# Patient Record
Sex: Female | Born: 1975 | Race: Black or African American | Hispanic: No | Marital: Single | Smoking: Former smoker
Health system: Southern US, Community
[De-identification: ages and names within clinical notes are randomized; demographics above are authoritative.]

## PROBLEM LIST (undated history)

## (undated) ENCOUNTER — Inpatient Hospital Stay (HOSPITAL_COMMUNITY): Payer: Self-pay

## (undated) ENCOUNTER — Emergency Department (HOSPITAL_COMMUNITY): Payer: Managed Care, Other (non HMO) | Source: Home / Self Care

## (undated) DIAGNOSIS — T8859XA Other complications of anesthesia, initial encounter: Secondary | ICD-10-CM

## (undated) DIAGNOSIS — I639 Cerebral infarction, unspecified: Secondary | ICD-10-CM

## (undated) DIAGNOSIS — K589 Irritable bowel syndrome without diarrhea: Secondary | ICD-10-CM

## (undated) DIAGNOSIS — T4145XA Adverse effect of unspecified anesthetic, initial encounter: Secondary | ICD-10-CM

## (undated) DIAGNOSIS — G43909 Migraine, unspecified, not intractable, without status migrainosus: Secondary | ICD-10-CM

## (undated) DIAGNOSIS — B999 Unspecified infectious disease: Secondary | ICD-10-CM

## (undated) DIAGNOSIS — I10 Essential (primary) hypertension: Secondary | ICD-10-CM

## (undated) DIAGNOSIS — K219 Gastro-esophageal reflux disease without esophagitis: Secondary | ICD-10-CM

## (undated) DIAGNOSIS — R002 Palpitations: Secondary | ICD-10-CM

## (undated) DIAGNOSIS — F329 Major depressive disorder, single episode, unspecified: Secondary | ICD-10-CM

## (undated) DIAGNOSIS — IMO0002 Reserved for concepts with insufficient information to code with codable children: Secondary | ICD-10-CM

## (undated) DIAGNOSIS — S0990XA Unspecified injury of head, initial encounter: Secondary | ICD-10-CM

## (undated) DIAGNOSIS — R600 Localized edema: Principal | ICD-10-CM

## (undated) DIAGNOSIS — D219 Benign neoplasm of connective and other soft tissue, unspecified: Secondary | ICD-10-CM

## (undated) DIAGNOSIS — R87619 Unspecified abnormal cytological findings in specimens from cervix uteri: Secondary | ICD-10-CM

## (undated) DIAGNOSIS — D649 Anemia, unspecified: Secondary | ICD-10-CM

## (undated) DIAGNOSIS — D519 Vitamin B12 deficiency anemia, unspecified: Secondary | ICD-10-CM

## (undated) HISTORY — DX: Unspecified injury of head, initial encounter: S09.90XA

## (undated) HISTORY — DX: Major depressive disorder, single episode, unspecified: F32.9

## (undated) HISTORY — DX: Cerebral infarction, unspecified: I63.9

## (undated) HISTORY — DX: Reserved for concepts with insufficient information to code with codable children: IMO0002

## (undated) HISTORY — DX: Essential (primary) hypertension: I10

## (undated) HISTORY — DX: Unspecified abnormal cytological findings in specimens from cervix uteri: R87.619

## (undated) HISTORY — DX: Localized edema: R60.0

## (undated) HISTORY — DX: Adverse effect of unspecified anesthetic, initial encounter: T41.45XA

## (undated) HISTORY — DX: Benign neoplasm of connective and other soft tissue, unspecified: D21.9

## (undated) HISTORY — DX: Other complications of anesthesia, initial encounter: T88.59XA

## (undated) HISTORY — DX: Gastro-esophageal reflux disease without esophagitis: K21.9

## (undated) HISTORY — DX: Migraine, unspecified, not intractable, without status migrainosus: G43.909

## (undated) HISTORY — DX: Irritable bowel syndrome, unspecified: K58.9

## (undated) HISTORY — PX: LAPAROSCOPY: SHX197

## (undated) HISTORY — DX: Palpitations: R00.2

## (undated) HISTORY — DX: Anemia, unspecified: D64.9

## (undated) HISTORY — DX: Unspecified infectious disease: B99.9

---

## 1997-02-10 HISTORY — PX: INDUCED ABORTION: SHX677

## 1998-02-10 DIAGNOSIS — F32A Depression, unspecified: Secondary | ICD-10-CM

## 1998-02-10 HISTORY — DX: Depression, unspecified: F32.A

## 2000-02-11 HISTORY — PX: TONSILLECTOMY: SUR1361

## 2003-02-11 HISTORY — PX: WISDOM TOOTH EXTRACTION: SHX21

## 2004-08-05 ENCOUNTER — Inpatient Hospital Stay (HOSPITAL_COMMUNITY): Admission: AD | Admit: 2004-08-05 | Discharge: 2004-08-05 | Payer: Self-pay | Admitting: Obstetrics and Gynecology

## 2004-10-17 ENCOUNTER — Other Ambulatory Visit: Admission: RE | Admit: 2004-10-17 | Discharge: 2004-10-17 | Payer: Self-pay | Admitting: Obstetrics and Gynecology

## 2005-03-13 ENCOUNTER — Inpatient Hospital Stay (HOSPITAL_COMMUNITY): Admission: AD | Admit: 2005-03-13 | Discharge: 2005-03-13 | Payer: Self-pay | Admitting: Obstetrics and Gynecology

## 2005-03-25 ENCOUNTER — Inpatient Hospital Stay (HOSPITAL_COMMUNITY): Admission: AD | Admit: 2005-03-25 | Discharge: 2005-03-27 | Payer: Self-pay | Admitting: Obstetrics and Gynecology

## 2005-03-29 ENCOUNTER — Encounter: Admission: RE | Admit: 2005-03-29 | Discharge: 2005-04-25 | Payer: Self-pay | Admitting: Obstetrics and Gynecology

## 2005-04-26 ENCOUNTER — Encounter: Admission: RE | Admit: 2005-04-26 | Discharge: 2005-05-26 | Payer: Self-pay | Admitting: Obstetrics and Gynecology

## 2005-05-27 ENCOUNTER — Encounter: Admission: RE | Admit: 2005-05-27 | Discharge: 2005-06-17 | Payer: Self-pay | Admitting: Obstetrics and Gynecology

## 2005-11-03 ENCOUNTER — Other Ambulatory Visit: Admission: RE | Admit: 2005-11-03 | Discharge: 2005-11-03 | Payer: Self-pay | Admitting: Obstetrics and Gynecology

## 2006-01-26 ENCOUNTER — Ambulatory Visit (HOSPITAL_COMMUNITY): Payer: Self-pay | Admitting: Psychiatry

## 2006-02-20 ENCOUNTER — Ambulatory Visit (HOSPITAL_COMMUNITY): Payer: Self-pay | Admitting: Psychiatry

## 2006-11-12 ENCOUNTER — Emergency Department (HOSPITAL_COMMUNITY): Admission: EM | Admit: 2006-11-12 | Discharge: 2006-11-13 | Payer: Self-pay | Admitting: Emergency Medicine

## 2008-09-11 ENCOUNTER — Encounter: Admission: RE | Admit: 2008-09-11 | Discharge: 2008-09-11 | Payer: Self-pay | Admitting: Obstetrics and Gynecology

## 2009-04-10 ENCOUNTER — Inpatient Hospital Stay (HOSPITAL_COMMUNITY): Admission: AD | Admit: 2009-04-10 | Discharge: 2009-04-12 | Payer: Self-pay | Admitting: Obstetrics and Gynecology

## 2009-12-18 ENCOUNTER — Emergency Department (HOSPITAL_COMMUNITY): Admission: EM | Admit: 2009-12-18 | Discharge: 2009-12-18 | Payer: Self-pay | Admitting: Emergency Medicine

## 2010-02-10 DIAGNOSIS — I639 Cerebral infarction, unspecified: Secondary | ICD-10-CM

## 2010-02-10 DIAGNOSIS — I10 Essential (primary) hypertension: Secondary | ICD-10-CM

## 2010-02-10 HISTORY — DX: Essential (primary) hypertension: I10

## 2010-02-10 HISTORY — DX: Cerebral infarction, unspecified: I63.9

## 2010-05-05 LAB — CBC
HCT: 26.6 % — ABNORMAL LOW (ref 36.0–46.0)
Hemoglobin: 10.1 g/dL — ABNORMAL LOW (ref 12.0–15.0)
Hemoglobin: 8.7 g/dL — ABNORMAL LOW (ref 12.0–15.0)
RBC: 3.02 MIL/uL — ABNORMAL LOW (ref 3.87–5.11)
RBC: 3.54 MIL/uL — ABNORMAL LOW (ref 3.87–5.11)
WBC: 5.3 10*3/uL (ref 4.0–10.5)
WBC: 9.2 10*3/uL (ref 4.0–10.5)

## 2010-05-05 LAB — RPR: RPR Ser Ql: NONREACTIVE

## 2010-05-05 LAB — CCBB MATERNAL DONOR DRAW

## 2010-06-28 NOTE — H&P (Signed)
[name], [name] NO.:  [id number]   MEDICAL RECORD NO.:  [id number]          PATIENT TYPE:  MAT   LOCATION:  [facility]                          FACILITY:  [facility]   PHYSICIAN:  Osborn Coho, M.D.   DATE OF BIRTH:  13-Jan-1976   DATE OF ADMISSION:  03/25/2005  DATE OF DISCHARGE:                                HISTORY & PHYSICAL   [name] is a 35 year old single black female gravida 3, para 1-0-1-1 at  40-3/7 weeks who presents with regular uterine contractions since 3:30 a.m.  She denies leaking or bleeding.  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  Her prenatal laboratories  were collected on September 18, 2004 hemoglobin 11.4, hematocrit 33.9, platelets  186,000.  Blood type O+.  Antibody negative.  Sickle cell trait negative.  RPR nonreactive.  Rubella immune.  HSV 2 positive.  HSV 1 negative.  Gonorrhea and Chlamydia negative.  Pap from October 17, 2004 within normal  limits.  One-hour Glucola from January 08, 2005 was 94.  RPR at that time  was nonreactive.  Hemoglobin at that time was 9.8.  Culture of the vaginal  tract for group B Strep on February 18, 2005 was positive.   HISTORY OF PRESENT PREGNANCY:  Patient presented for care at [facility] on September 18, 2004 at 13-4/[redacted] weeks gestation.  Pregnancy  ultrasonography at 17-5/[redacted] weeks gestation shows growth consistent with  previous dating confirming [name] of March 22, 2005.  The patient has had a  history of abnormal Pap smears but her Pap smear with this pregnancy was  within normal limits.  Urine culture was sent at [redacted] weeks gestation due to  dysuria.  Culture was actually negative.  Patient was referred to [facility]  for counseling at [redacted] weeks gestation due to a history  of depression.  Patient at 36 weeks reported increased depression, but  denied suicidal or homicidal ideations.  Declined medications or counseling  at present.  She also declined HSV prophylaxis since she has never had an  HSV outbreak.  Rest of her prenatal care has been unremarkable.   OB HISTORY:  She is a gravida 3, para 1-0-1-1.  In 1997 she had a TAB.  In  July of 2004 she had a vaginal delivery of a female infant weighing 5 pounds  at [redacted] weeks gestation with a labor that was less than 5 hours in length.  She had an epidural for anesthesia.  Infant's name was [name] and he was  born in [place].   MEDICAL HISTORY:  She has no medication allergies.  She experienced menarche  at the age of 61 with 28-day cycles lasting five to seven days.  She has  used Ortho Tri-Cyclen in the past for contraception.  She had an abnormal  Pap smear two years ago.  She has had colposcopies and a LEEP procedure in  2003.  She had laparoscopy in 1998.  Her partner in the past was told he had  herpes.  Patient has never had an HSV outbreak.  She has had bacterial  vaginosis.  She reports having had the usual childhood illnesses.  She has a  history of anemia.  She has IBS.  She also has a history of depression.   SURGICAL HISTORY:  Remarkable for wisdom teeth extraction and laparoscopy.   FAMILY HISTORY:  Remarkable for multiple family members with heart disease  and hypertension.  Mother and maternal grandmother with varicose veins.  Maternal grandfather with diabetes.  Maternal grandmother with  fibroidectomy.  Mother with migraines.  Maternal aunt with unknown type of  cancer.   GENETIC HISTORY:  Remarkable for father of the baby's mother with unknown  type of heart problem.   SOCIAL HISTORY:  Patient is single.  Father of the baby is involved and  support.  His name is [name].  They both have two years of college education.  She is involved  full-time as a Theatre manager.  He is employed full-time  as a Psychiatric nurse.  They deny any alcohol, tobacco, or illicit drug use  with the pregnancy.   OBJECTIVE:  VITAL SIGNS:  Blood pressure 130/87.  Other vital signs are  stable.  She is afebrile.  HEENT:  Grossly within normal limits.  CHEST:  Clear to auscultation.  HEART:  Regular rate and rhythm.  ABDOMEN:  Gravid in contour with fundal height extending approximately 40 cm  above the pubic symphysis.  Fetal heart rate is reactive and reassuring with  occasional variables.  Contractions are every four minutes.  PELVIC:  4 cm, 90%, vertex -2 with intact bag of waters and no signs of HSV  lesions.  EXTREMITIES:  Normal.   ASSESSMENT:  1.  Intrauterine pregnancy at term.  2.  Early labor.  3.  Group B Strep positive.   PLAN:  1.  Admit to birthing suites for consult.  Dr. Su Hilt has been notified.  2.  Routine C.N.M. orders.  3.  Start penicillin G for group B Strep prophylaxis.  4.  Anticipate spontaneous vaginal birth.      Cam Hai, C.N.M.      Osborn Coho, M.D.  Electronically Signed    KS/MEDQ  D:  03/25/2005  T:  03/25/2005  Job:  [id number]

## 2011-02-11 NOTE — L&D Delivery Note (Signed)
Delivery Note  Pt was complete w BBOW at 1843, AROM lg amt light MSAF, vtx descended and pt pushed w 2-3 ctx FHR stable   At 6:48 PM a viable female was delivered via Vaginal, Spontaneous Delivery (Presentation: ; ROA ). Shoulders delivered easily, Infant w lusty cry  APGAR: 9, 9; weight 7 lb 7 oz (3374 g).   Placenta status: spontaneous, intact,   Sent to path for hx HTN, Cord: 3 vessels with the following complications: None.  Cord pH: not done  Anesthesia: Epidural  Episiotomy: None Lacerations: 1st degree Suture Repair: 3-0 vicryl rapide Est. Blood Loss (mL): 350   Dr Pennie Rushing called to BS to evaluate laceration, cervix visualized w speculum and intact   Mom to postpartum.  Baby to nursery-stable. Pt plans to BF Desires BTL, Dr Pennie Rushing discussed w pt, consent on chart Will leave epidural in place and NPO after midnight   Navy Belay M 12/20/2011, 1:55 AM

## 2011-02-26 ENCOUNTER — Other Ambulatory Visit: Payer: Self-pay | Admitting: Obstetrics and Gynecology

## 2011-02-26 DIAGNOSIS — N644 Mastodynia: Secondary | ICD-10-CM

## 2011-03-05 ENCOUNTER — Ambulatory Visit
Admission: RE | Admit: 2011-03-05 | Discharge: 2011-03-05 | Disposition: A | Discharge status: Home / Self Care | Payer: Medicaid Other | Source: Ambulatory Visit | Attending: Obstetrics and Gynecology | Admitting: Obstetrics and Gynecology

## 2011-03-05 DIAGNOSIS — N644 Mastodynia: Secondary | ICD-10-CM

## 2011-05-15 ENCOUNTER — Ambulatory Visit: Payer: Self-pay | Admitting: Obstetrics and Gynecology

## 2011-05-16 ENCOUNTER — Other Ambulatory Visit: Payer: Self-pay | Admitting: Obstetrics and Gynecology

## 2011-05-16 ENCOUNTER — Encounter (INDEPENDENT_AMBULATORY_CARE_PROVIDER_SITE_OTHER): Payer: Medicaid Other | Admitting: Obstetrics and Gynecology

## 2011-05-16 DIAGNOSIS — N898 Other specified noninflammatory disorders of vagina: Secondary | ICD-10-CM

## 2011-05-16 DIAGNOSIS — R102 Pelvic and perineal pain: Secondary | ICD-10-CM

## 2011-05-16 DIAGNOSIS — N949 Unspecified condition associated with female genital organs and menstrual cycle: Secondary | ICD-10-CM

## 2011-05-16 DIAGNOSIS — N912 Amenorrhea, unspecified: Secondary | ICD-10-CM

## 2011-05-16 DIAGNOSIS — Z331 Pregnant state, incidental: Secondary | ICD-10-CM

## 2011-05-20 ENCOUNTER — Telehealth: Payer: Self-pay | Admitting: Obstetrics and Gynecology

## 2011-05-21 ENCOUNTER — Telehealth: Payer: Self-pay | Admitting: Obstetrics and Gynecology

## 2011-05-21 ENCOUNTER — Other Ambulatory Visit: Payer: Self-pay | Admitting: Obstetrics and Gynecology

## 2011-05-21 MED ORDER — NIFEDIPINE ER OSMOTIC RELEASE 30 MG PO TB24: 30.0000 mg | ORAL_TABLET | Freq: Every day | ORAL | Status: DC

## 2011-05-21 NOTE — Telephone Encounter (Signed)
TC to pt.   Informed per Dr Doy Hutching will call in new RX.  To call with any concerns.   To keep appt. 05/26/11.  Pt verbalizes comprehension.  Novia Lansberry, Sabino Snipes

## 2011-05-21 NOTE — Telephone Encounter (Signed)
Returned pt's call.   States left message for after hours but never received call back.   States took Labetalol x2 and caused hands to shake.   Feels  Has though BP is inc.   To consult with EP.

## 2011-05-26 ENCOUNTER — Other Ambulatory Visit: Payer: Medicaid Other

## 2011-05-26 ENCOUNTER — Other Ambulatory Visit: Payer: Self-pay | Admitting: Obstetrics and Gynecology

## 2011-05-26 ENCOUNTER — Encounter: Payer: Self-pay | Admitting: Obstetrics and Gynecology

## 2011-05-26 ENCOUNTER — Ambulatory Visit (INDEPENDENT_AMBULATORY_CARE_PROVIDER_SITE_OTHER): Payer: Medicaid Other | Admitting: Obstetrics and Gynecology

## 2011-05-26 ENCOUNTER — Ambulatory Visit: Payer: Medicaid Other

## 2011-05-26 VITALS — BP 120/82 | Temp 98.8°F | Wt 120.0 lb

## 2011-05-26 DIAGNOSIS — R102 Pelvic and perineal pain: Secondary | ICD-10-CM

## 2011-05-26 DIAGNOSIS — D649 Anemia, unspecified: Secondary | ICD-10-CM

## 2011-05-26 DIAGNOSIS — R350 Frequency of micturition: Secondary | ICD-10-CM

## 2011-05-26 DIAGNOSIS — D509 Iron deficiency anemia, unspecified: Secondary | ICD-10-CM | POA: Insufficient documentation

## 2011-05-26 DIAGNOSIS — B977 Papillomavirus as the cause of diseases classified elsewhere: Secondary | ICD-10-CM

## 2011-05-26 DIAGNOSIS — I10 Essential (primary) hypertension: Secondary | ICD-10-CM

## 2011-05-26 DIAGNOSIS — B009 Herpesviral infection, unspecified: Secondary | ICD-10-CM

## 2011-05-26 DIAGNOSIS — A630 Anogenital (venereal) warts: Secondary | ICD-10-CM | POA: Insufficient documentation

## 2011-05-26 DIAGNOSIS — F329 Major depressive disorder, single episode, unspecified: Secondary | ICD-10-CM

## 2011-05-26 LAB — POCT URINALYSIS DIPSTICK
Bilirubin, UA: NEGATIVE
Ketones, UA: NEGATIVE
Nitrite, UA: NEGATIVE
pH, UA: 5

## 2011-05-26 NOTE — Patient Instructions (Signed)
Patient needs to be scheduled for a NOB visit ( 10w 2 d by ultrasound today). Patient also will need a note to return to work tomorrow.

## 2011-05-26 NOTE — Progress Notes (Signed)
Patient seen 05/16/11 for pelvic pain and 8w 5d pregnant by dates returns for follow up.Patient states that her pain is better.She believes that some of her pain is due to constipation. Patient also takes Procardia with no problems (had hands tremble with Labetalol).  O: Preliminary U/S results- 10 weeks 3 days with FHR 158 bpm and right ovarian cyst 3.9 x 3.3 x 3.4 cm with right ovary measuring 4.7 x 3.6 x 3.4cm;  left ovary- 2.5 x 1.7 x 1.8cm  U/A trace leuk/blood;  urine for OB culture  A; Pregnant 10w 3 d     Pelvic Pain-improving     Constipation     Urinary frequency due to pregnancy & increased fluids     HTN-improving  P: NOB visit      urine for culture

## 2011-05-27 LAB — US OB COMP LESS 14 WKS

## 2011-05-28 LAB — URINE CULTURE: Colony Count: NO GROWTH

## 2011-06-18 ENCOUNTER — Encounter: Payer: Self-pay | Admitting: Obstetrics and Gynecology

## 2011-06-30 ENCOUNTER — Ambulatory Visit (INDEPENDENT_AMBULATORY_CARE_PROVIDER_SITE_OTHER): Payer: Medicaid Other | Admitting: Obstetrics and Gynecology

## 2011-06-30 VITALS — BP 120/80

## 2011-06-30 DIAGNOSIS — Z331 Pregnant state, incidental: Secondary | ICD-10-CM

## 2011-06-30 DIAGNOSIS — D649 Anemia, unspecified: Secondary | ICD-10-CM

## 2011-06-30 LAB — POCT URINALYSIS DIPSTICK
Glucose, UA: NEGATIVE
Leukocytes, UA: NEGATIVE
Nitrite, UA: NEGATIVE
Protein, UA: NEGATIVE
Urobilinogen, UA: NEGATIVE

## 2011-06-30 LAB — TSH: TSH: 1.575 u[IU]/mL (ref 0.350–4.500)

## 2011-06-30 NOTE — Progress Notes (Signed)
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 CONSULTED WITH [facility] REGARDING HX OF PALPITATIONS AND SHORTNESS OF BREATH.  TSH ORDERED.

## 2011-07-01 LAB — PRENATAL PANEL VII
Antibody Screen: NEGATIVE
Basophils Absolute: 0 10*3/uL (ref 0.0–0.1)
Eosinophils Absolute: 0.1 10*3/uL (ref 0.0–0.7)
Eosinophils Relative: 2 % (ref 0–5)
Hepatitis B Surface Ag: NEGATIVE
Lymphocytes Relative: 32 % (ref 12–46)
Lymphs Abs: 1.8 10*3/uL (ref 0.7–4.0)
MCH: 25.7 pg — ABNORMAL LOW (ref 26.0–34.0)
MCV: 79.4 fL (ref 78.0–100.0)
Neutrophils Relative %: 59 % (ref 43–77)
Platelets: 181 10*3/uL (ref 150–400)
RBC: 3.74 MIL/uL — ABNORMAL LOW (ref 3.87–5.11)
RDW: 20.7 % — ABNORMAL HIGH (ref 11.5–15.5)
Rubella: 85.6 IU/mL — ABNORMAL HIGH
WBC: 5.8 10*3/uL (ref 4.0–10.5)

## 2011-07-01 LAB — CULTURE, OB URINE: Colony Count: NO GROWTH

## 2011-07-02 ENCOUNTER — Other Ambulatory Visit: Payer: Self-pay | Admitting: Obstetrics and Gynecology

## 2011-07-02 ENCOUNTER — Telehealth: Payer: Self-pay | Admitting: Obstetrics and Gynecology

## 2011-07-02 MED ORDER — FE BISGLYCINATE-FE POLYSAC 40-20 MG PO CAPS: 1.0000 | ORAL_CAPSULE | Freq: Two times a day (BID) | ORAL | Status: DC

## 2011-07-02 NOTE — Progress Notes (Signed)
Addended by: Malissa Hippo. on: 07/02/2011 07:34 AM   Modules accepted: Orders

## 2011-07-02 NOTE — Telephone Encounter (Signed)
TC to pt. LM to return call regarding Fe  RX.

## 2011-07-02 NOTE — Telephone Encounter (Signed)
TC from pt. Informed of  Hg results. Advised to take Fe RX that was ordered by SL.the patient. Pt verbalizes comprehension.

## 2011-07-08 ENCOUNTER — Ambulatory Visit (INDEPENDENT_AMBULATORY_CARE_PROVIDER_SITE_OTHER): Payer: Medicaid Other | Admitting: Obstetrics and Gynecology

## 2011-07-08 ENCOUNTER — Encounter: Payer: Self-pay | Admitting: Obstetrics and Gynecology

## 2011-07-08 VITALS — BP 110/78 | Ht 64.5 in | Wt 159.0 lb

## 2011-07-08 DIAGNOSIS — G43909 Migraine, unspecified, not intractable, without status migrainosus: Secondary | ICD-10-CM | POA: Insufficient documentation

## 2011-07-08 DIAGNOSIS — F329 Major depressive disorder, single episode, unspecified: Secondary | ICD-10-CM

## 2011-07-08 DIAGNOSIS — Z8619 Personal history of other infectious and parasitic diseases: Secondary | ICD-10-CM | POA: Insufficient documentation

## 2011-07-08 DIAGNOSIS — F1290 Cannabis use, unspecified, uncomplicated: Secondary | ICD-10-CM

## 2011-07-08 DIAGNOSIS — Z8614 Personal history of Methicillin resistant Staphylococcus aureus infection: Secondary | ICD-10-CM

## 2011-07-08 DIAGNOSIS — K589 Irritable bowel syndrome without diarrhea: Secondary | ICD-10-CM

## 2011-07-08 DIAGNOSIS — B977 Papillomavirus as the cause of diseases classified elsewhere: Secondary | ICD-10-CM

## 2011-07-08 DIAGNOSIS — Z331 Pregnant state, incidental: Secondary | ICD-10-CM

## 2011-07-08 DIAGNOSIS — A630 Anogenital (venereal) warts: Secondary | ICD-10-CM

## 2011-07-08 DIAGNOSIS — D649 Anemia, unspecified: Secondary | ICD-10-CM

## 2011-07-08 DIAGNOSIS — Z9851 Tubal ligation status: Secondary | ICD-10-CM

## 2011-07-08 DIAGNOSIS — B009 Herpesviral infection, unspecified: Secondary | ICD-10-CM

## 2011-07-08 DIAGNOSIS — Z8673 Personal history of transient ischemic attack (TIA), and cerebral infarction without residual deficits: Secondary | ICD-10-CM

## 2011-07-08 DIAGNOSIS — I10 Essential (primary) hypertension: Secondary | ICD-10-CM

## 2011-07-08 DIAGNOSIS — F17200 Nicotine dependence, unspecified, uncomplicated: Secondary | ICD-10-CM

## 2011-07-08 DIAGNOSIS — D259 Leiomyoma of uterus, unspecified: Secondary | ICD-10-CM

## 2011-07-08 DIAGNOSIS — F1210 Cannabis abuse, uncomplicated: Secondary | ICD-10-CM

## 2011-07-08 DIAGNOSIS — D219 Benign neoplasm of connective and other soft tissue, unspecified: Secondary | ICD-10-CM

## 2011-07-08 LAB — POCT WET PREP (WET MOUNT): pH: 4.5

## 2011-07-08 NOTE — Progress Notes (Signed)
Pap done 02/21/2011 WNL  Declines genetic screenings

## 2011-07-08 NOTE — Patient Instructions (Signed)
ABCs of Pregnancy A Antepartum care is very important. Be sure you see your doctor and get prenatal care as soon as you think you are pregnant. At this time, you will be tested for infection, genetic abnormalities and potential problems with you and the pregnancy. This is the time to discuss diet, exercise, work, medications, labor, pain medication during labor and the possibility of a cesarean delivery. Ask any questions that may concern you. It is important to see your doctor regularly throughout your pregnancy. Avoid exposure to toxic substances and chemicals - such as cleaning solvents, lead and mercury, some insecticides, and paint. Pregnant women should avoid exposure to paint fumes, and fumes that cause you to feel ill, dizzy or faint. When possible, it is a good idea to have a pre-pregnancy consultation with your caregiver to begin some important recommendations your caregiver suggests such as, taking folic acid, exercising, quitting smoking, avoiding alcoholic beverages, etc. B Breastfeeding is the healthiest choice for both you and your baby. It has many nutritional benefits for the baby and health benefits for the mother. It also creates a very tight and loving bond between the baby and mother. Talk to your doctor, your family and friends, and your employer about how you choose to feed your baby and how they can support you in your decision. Not all birth defects can be prevented, but a woman can take actions that may increase her chance of having a healthy baby. Many birth defects happen very early in pregnancy, sometimes before a woman even knows she is pregnant. Birth defects or abnormalities of any child in your or the father's family should be discussed with your caregiver. Get a good support bra as your breast size changes. Wear it especially when you exercise and when nursing.  C Celebrate the news of your pregnancy with the your spouse/father and family. Childbirth classes are helpful to  take for you and the spouse/father because it helps to understand what happens during the pregnancy, labor and delivery. Cesarean delivery should be discussed with your doctor so you are prepared for that possibility. The pros and cons of circumcision if it is a boy, should be discussed with your pediatrician. Cigarette smoking during pregnancy can result in low birth weight babies. It has been associated with infertility, miscarriages, tubal pregnancies, infant death (mortality) and poor health (morbidity) in childhood. Additionally, cigarette smoking may cause long-term learning disabilities. If you smoke, you should try to quit before getting pregnant and not smoke during the pregnancy. Secondary smoke may also harm a mother and her developing baby. It is a good idea to ask people to stop smoking around you during your pregnancy and after the baby is born. Extra calcium is necessary when you are pregnant and is found in your prenatal vitamin, in dairy products, green leafy vegetables and in calcium supplements. D A healthy diet according to your current weight and height, along with vitamins and mineral supplements should be discussed with your caregiver. Domestic abuse or violence should be made known to your doctor right away to get the situation corrected. Drink more water when you exercise to keep hydrated. Discomfort of your back and legs usually develops and progresses from the middle of the second trimester through to delivery of the baby. This is because of the enlarging baby and uterus, which may also affect your balance. Do not take illegal drugs. Illegal drugs can seriously harm the baby and you. Drink extra fluids (water is best) throughout pregnancy to help   your body keep up with the increases in your blood volume. Drink at least 6 to 8 glasses of water, fruit juice, or milk each day. A good way to know you are drinking enough fluid is when your urine looks almost like clear water or is very light  yellow.  E Eat healthy to get the nutrients you and your unborn baby need. Your meals should include the five basic food groups. Exercise (30 minutes of light to moderate exercise a day) is important and encouraged during pregnancy, if there are no medical problems or problems with the pregnancy. Exercise that causes discomfort or dizziness should be stopped and reported to your caregiver. Emotions during pregnancy can change from being ecstatic to depression and should be understood by you, your partner and your family. F Fetal screening with ultrasound, amniocentesis and monitoring during pregnancy and labor is common and sometimes necessary. Take 400 micrograms of folic acid daily both before, when possible, and during the first few months of pregnancy to reduce the risk of birth defects of the brain and spine. All women who could possibly become pregnant should take a vitamin with folic acid, every day. It is also important to eat a healthy diet with fortified foods (enriched grain products, including cereals, rice, breads, and pastas) and foods with natural sources of folate (orange juice, green leafy vegetables, beans, peanuts, broccoli, asparagus, peas, and lentils). The father should be involved with all aspects of the pregnancy including, the prenatal care, childbirth classes, labor, delivery, and postpartum time. Fathers may also have emotional concerns about being a father, financial needs, and raising a family. G Genetic testing should be done appropriately. It is important to know your family and the father's history. If there have been problems with pregnancies or birth defects in your family, report these to your doctor. Also, genetic counselors can talk with you about the information you might need in making decisions about having a family. You can call a major medical center in your area for help in finding a board-certified genetic counselor. Genetic testing and counseling should be done  before pregnancy when possible, especially if there is a history of problems in the mother's or father's family. Certain ethnic backgrounds are more at risk for genetic defects. H Get familiar with the hospital where you will be having your baby. Get to know how long it takes to get there, the labor and delivery area, and the hospital procedures. Be sure your medical insurance is accepted there. Get your home ready for the baby including, clothes, the baby's room (when possible), furniture and car seat. Hand washing is important throughout the day, especially after handling raw meat and poultry, changing the baby's diaper or using the bathroom. This can help prevent the spread of many bacteria and viruses that cause infection. Your hair may become dry and thinner, but will return to normal a few weeks after the baby is born. Heartburn is a common problem that can be treated by taking antacids recommended by your caregiver, eating smaller meals 5 or 6 times a day, not drinking liquids when eating, drinking between meals and raising the head of your bed 2 to 3 inches. I Insurance to cover you, the baby, doctor and hospital should be reviewed so that you will be prepared to pay any costs not covered by your insurance plan. If you do not have medical insurance, there are usually clinics and services available for you in your community. Take 30 milligrams of iron during   your pregnancy as prescribed by your doctor to reduce the risk of low red blood cells (anemia) later in pregnancy. All women of childbearing age should eat a diet rich in iron. J There should be a joint effort for the mother, father and any other children to adapt to the pregnancy financially, emotionally, and psychologically during the pregnancy. Join a support group for moms-to-be. Or, join a class on parenting or childbirth. Have the family participate when possible. K Know your limits. Let your caregiver know if you experience any of the  following:   Pain of any kind.   Strong cramps.   You develop a lot of weight in a short period of time (5 pounds in 3 to 5 days).   Vaginal bleeding, leaking of amniotic fluid.   Headache, vision problems.   Dizziness, fainting, shortness of breath.   Chest pain.   Fever of 102 F (38.9 C) or higher.   Gush of clear fluid from your vagina.   Painful urination.   Domestic violence.   Irregular heartbeat (palpitations).   Rapid beating of the heart (tachycardia).   Constant feeling sick to your stomach (nauseous) and vomiting.   Trouble walking, fluid retention (edema).   Muscle weakness.   If your baby has decreased activity.   Persistent diarrhea.   Abnormal vaginal discharge.   Uterine contractions at 20-minute intervals.   Back pain that travels down your leg.  L Learn and practice that what you eat and drink should be in moderation and healthy for you and your baby. Legal drugs such as alcohol and caffeine are important issues for pregnant women. There is no safe amount of alcohol a woman can drink while pregnant. Fetal alcohol syndrome, a disorder characterized by growth retardation, facial abnormalities, and central nervous system dysfunction, is caused by a woman's use of alcohol during pregnancy. Caffeine, found in tea, coffee, soft drinks and chocolate, should also be limited. Be sure to read labels when trying to cut down on caffeine during pregnancy. More than 200 foods, beverages, and over-the-counter medications contain caffeine and have a high salt content! There are coffees and teas that do not contain caffeine. M Medical conditions such as diabetes, epilepsy, and high blood pressure should be treated and kept under control before pregnancy when possible, but especially during pregnancy. Ask your caregiver about any medications that may need to be changed or adjusted during pregnancy. If you are currently taking any medications, ask your caregiver if it  is safe to take them while you are pregnant or before getting pregnant when possible. Also, be sure to discuss any herbs or vitamins you are taking. They are medicines, too! Discuss with your doctor all medications, prescribed and over-the-counter, that you are taking. During your prenatal visit, discuss the medications your doctor may give you during labor and delivery. N Never be afraid to ask your doctor or caregiver questions about your health, the progress of the pregnancy, family problems, stressful situations, and recommendation for a pediatrician, if you do not have one. It is better to take all precautions and discuss any questions or concerns you may have during your office visits. It is a good idea to write down your questions before you visit the doctor. O Over-the-counter cough and cold remedies may contain alcohol or other ingredients that should be avoided during pregnancy. Ask your caregiver about prescription, herbs or over-the-counter medications that you are taking or may consider taking while pregnant.  P Physical activity during pregnancy can   benefit both you and your baby by lessening discomfort and fatigue, providing a sense of well-being, and increasing the likelihood of early recovery after delivery. Light to moderate exercise during pregnancy strengthens the belly (abdominal) and back muscles. This helps improve posture. Practicing yoga, walking, swimming, and cycling on a stationary bicycle are usually safe exercises for pregnant women. Avoid scuba diving, exercise at high altitudes (over 3000 feet), skiing, horseback riding, contact sports, etc. Always check with your doctor before beginning any kind of exercise, especially during pregnancy and especially if you did not exercise before getting pregnant. Q Queasiness, stomach upset and morning sickness are common during pregnancy. Eating a couple of crackers or dry toast before getting out of bed. Foods that you normally love may  make you feel sick to your stomach. You may need to substitute other nutritious foods. Eating 5 or 6 small meals a day instead of 3 large ones may make you feel better. Do not drink with your meals, drink between meals. Questions that you have should be written down and asked during your prenatal visits. R Read about and make plans to baby-proof your home. There are important tips for making your home a safer environment for your baby. Review the tips and make your home safer for you and your baby. Read food labels regarding calories, salt and fat content in the food. S Saunas, hot tubs, and steam rooms should be avoided while you are pregnant. Excessive high heat may be harmful during your pregnancy. Your caregiver will screen and examine you for sexually transmitted diseases and genetic disorders during your prenatal visits. Learn the signs of labor. Sexual relations while pregnant is safe unless there is a medical or pregnancy problem and your caregiver advises against it. T Traveling long distances should be avoided especially in the third trimester of your pregnancy. If you do have to travel out of state, be sure to take a copy of your medical records and medical insurance plan with you. You should not travel long distances without seeing your doctor first. Most airlines will not allow you to travel after 36 weeks of pregnancy. Toxoplasmosis is an infection caused by a parasite that can seriously harm an unborn baby. Avoid eating undercooked meat and handling cat litter. Be sure to wear gloves when gardening. Tingling of the hands and fingers is not unusual and is due to fluid retention. This will go away after the baby is born. U Womb (uterus) size increases during the first trimester. Your kidneys will begin to function more efficiently. This may cause you to feel the need to urinate more often. You may also leak urine when sneezing, coughing or laughing. This is due to the growing uterus pressing  against your bladder, which lies directly in front of and slightly under the uterus during the first few months of pregnancy. If you experience burning along with frequency of urination or bloody urine, be sure to tell your doctor. The size of your uterus in the third trimester may cause a problem with your balance. It is advisable to maintain good posture and avoid wearing high heels during this time. An ultrasound of your baby may be necessary during your pregnancy and is safe for you and your baby. V Vaccinations are an important concern for pregnant women. Get needed vaccines before pregnancy. Center for Disease Control (www.cdc.gov) has clear guidelines for the use of vaccines during pregnancy. Review the list, be sure to discuss it with your doctor. Prenatal vitamins are helpful   and healthy for you and the baby. Do not take extra vitamins except what is recommended. Taking too much of certain vitamins can cause overdose problems. Continuous vomiting should be reported to your caregiver. Varicose veins may appear especially if there is a family history of varicose veins. They should subside after the delivery of the baby. Support hose helps if there is leg discomfort. W Being overweight or underweight during pregnancy may cause problems. Try to get within 15 pounds of your ideal weight before pregnancy. Remember, pregnancy is not a time to be dieting! Do not stop eating or start skipping meals as your weight increases. Both you and your baby need the calories and nutrition you receive from a healthy diet. Be sure to consult with your doctor about your diet. There is a formula and diet plan available depending on whether you are overweight or underweight. Your caregiver or nutritionist can help and advise you if necessary. X Avoid X-rays. If you must have dental work or diagnostic tests, tell your dentist or physician that you are pregnant so that extra care can be taken. X-rays should only be taken when  the risks of not taking them outweigh the risk of taking them. If needed, only the minimum amount of radiation should be used. When X-rays are necessary, protective lead shields should be used to cover areas of the body that are not being X-rayed. Y Your baby loves you. Breastfeeding your baby creates a loving and very close bond between the two of you. Give your baby a healthy environment to live in while you are pregnant. Infants and children require constant care and guidance. Their health and safety should be carefully watched at all times. After the baby is born, rest or take a nap when the baby is sleeping. Z Get your ZZZs. Be sure to get plenty of rest. Resting on your side as often as possible, especially on your left side is advised. It provides the best circulation to your baby and helps reduce swelling. Try taking a nap for 30 to 45 minutes in the afternoon when possible. After the baby is born rest or take a nap when the baby is sleeping. Try elevating your feet for that amount of time when possible. It helps the circulation in your legs and helps reduce swelling.  Most information courtesy of the CDC. Document Released: 01/27/2005 Document Revised: 01/16/2011 Document Reviewed: 10/11/2008 [employer] Patient Information 2012 [employer]. 

## 2011-07-09 DIAGNOSIS — Z8614 Personal history of Methicillin resistant Staphylococcus aureus infection: Secondary | ICD-10-CM | POA: Insufficient documentation

## 2011-07-09 DIAGNOSIS — F1290 Cannabis use, unspecified, uncomplicated: Secondary | ICD-10-CM | POA: Insufficient documentation

## 2011-07-09 LAB — GC/CHLAMYDIA PROBE AMP, GENITAL
Chlamydia, DNA Probe: NEGATIVE
GC Probe Amp, Genital: NEGATIVE

## 2011-07-17 DIAGNOSIS — F17200 Nicotine dependence, unspecified, uncomplicated: Secondary | ICD-10-CM | POA: Insufficient documentation

## 2011-07-17 NOTE — Progress Notes (Addendum)
Subjective:    [name] is being seen today for her first obstetrical visit.  This is not a planned pregnancy. She is at [redacted]w[redacted]d gestation. Her obstetrical history is significant for advanced maternal age, smoker and CHTN and MJ use. Relationship with FOB: "Wayne" involved but pt lives alone w children. Patient does intend to breast feed. Pregnancy history fully reviewed. Pt desires tubal ligation.   Patient reports as described in ROS. Pt was seen by E.Lowell Guitar, PA in our office at about 10wks for pelvic pain that resolved she had an [place] at that time that as normal and c/w dates.   Review of Systems:   Review of Systems  Eyes: Positive for visual disturbance.       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       Improving nausea States has intermittent R sided lower pelvic groin pain, a few times a week, denies now  Neurological: Positive for headaches.       Declines today, but states she can tell when her BP is up because she gets a headache,   Psychiatric/Behavioral:       Hx depression, but states she feels ok now Denies SI/HI   All other systems reviewed and are negative.    Objective:     BP 110/78  Ht 5' 4.5" (1.638 m)  Wt 159 lb (72.122 kg)  BMI 26.87 kg/m2  LMP 03/15/2011 Physical Exam  Nursing note and vitals reviewed. Constitutional: She is oriented to person, place, and time. She appears well-developed and well-nourished. No distress.  HENT:  Head: Normocephalic and atraumatic.  Eyes: Pupils are equal, round, and reactive to light.  Neck: Normal range of motion. Neck supple. No thyromegaly present.  Cardiovascular: Normal rate and normal heart sounds.   Respiratory: Effort normal and breath sounds normal.  GI: Soft. Bowel sounds are normal. She exhibits no  distension.       gravid  Genitourinary: Vagina normal. No vaginal discharge found.       cx is cl/th/high/firm Wet prep neg   Musculoskeletal: Normal range of motion. She exhibits no edema.  Neurological: She is alert and oriented to person, place, and time.  Skin: Skin is warm and dry.  Psychiatric: She has a normal mood and affect. Her behavior is normal.    Maternal Exam:  Abdomen: Patient reports no abdominal tenderness. Fundal height is 16.    Introitus: Normal vulva. Normal vagina.  Vagina is negative for discharge.  Pelvis: adequate for delivery.   Cervix: Cervix evaluated by sterile speculum exam and digital exam.     Fetal Exam Fetal Monitor Review: Mode: hand-held doppler probe.   Baseline rate: 140.         Assessment:    Pregnancy: [id number] Patient Active Problem List  Diagnoses  . Anemia  . HSV-2 (herpes simplex virus 2) infection  . Depression  . HPV (human papilloma virus) anogenital infection  . HTN (hypertension)  . History of chlamydia  . History of stroke  . Migraine  . IBS (irritable bowel syndrome)  . Fibroid  . History of trichomoniasis  . Marijuana use - history   . History of MRSA infection  . Tubal ligation status       Plan:    Rv'd  previous records and sickle cell testing was neg  Initial labs rv'd - urine cx neg, bl type O pos, TSH=1.575, Hg=9.6  Pap was nl in Jan '13 GC/CT sent  Wet prep neg Pt states she had been seen by neurology that diagnosed ocular migraine but the she didn't like the medicine so she self dc'd, states she just "deals w it"  Prenatal vitamins. And rec FE, ordered niferex per pt preference and better tolerance, rv'd floridex but pt states it's too expensive  Continue procardia 30mg  XL Problem list reviewed and updated. Pt declines any genetic screens Rv'd routines and enc pt to cease smoking, has "cut down" rv'd importance of taking FE supp for anemia and eating healthy w FE foods Rv'd when to call office   Will plan to give valtrex at 34wks, pt will notify us if she feels she's having an outbreak Pt desires BTL - needs to sign consent  Will review POC w MD re: CHTN  F/U 4wks ROB and anatomy US    Nusaybah Ivie M 07/17/2011

## 2011-07-17 NOTE — Progress Notes (Signed)
Addended by: Malissa Hippo. on: 07/17/2011 03:03 AM   Modules accepted: Level of Service

## 2011-07-22 ENCOUNTER — Telehealth: Payer: Self-pay

## 2011-07-22 ENCOUNTER — Encounter: Payer: Self-pay | Admitting: Obstetrics and Gynecology

## 2011-07-22 NOTE — Telephone Encounter (Signed)
Message copied by Rolla Plate on Tue Jul 22, 2011  9:08 AM ------      Message from: Jaymes Graff      Created: Mon Jul 21, 2011  7:26 PM       Pt was seen by neurologist.  Please obtain a copy of the consult.

## 2011-07-22 NOTE — Progress Notes (Signed)
Per ND Pt needs baseline PIH labs and 24 hour urine at [facility] [place] q4wks starting at 28wksa and antenatal testing at 32weeks,  High risk list

## 2011-07-22 NOTE — Telephone Encounter (Signed)
Try calling pt to obtain neurology consult pt number not working at this time

## 2011-07-23 NOTE — Telephone Encounter (Signed)
Lm on vm tcb

## 2011-07-24 NOTE — Telephone Encounter (Signed)
Spoke with prgd getting neurology consult pt states she didn't see a neurologist due to insurance issues pt states saw a eye doctor and he told her she was having migraines advised pt will inform ND pt voice understanding

## 2011-07-28 ENCOUNTER — Telehealth: Payer: Self-pay

## 2011-07-28 NOTE — Telephone Encounter (Signed)
Ref to [facility] neurologic for h/o migraines they will contact pt with appt

## 2011-07-28 NOTE — Telephone Encounter (Signed)
Message copied by Rolla Plate on Mon Jul 28, 2011  9:42 AM ------      Message from: Jaymes Graff      Created: Thu Jul 24, 2011  9:50 PM      Regarding: RE: consult       Yes please refer her.  Thank you      ----- Message -----         From: Claudie Leach, CMA         Sent: 07/24/2011  10:11 AM           To: Michael Litter, MD      Subject: consult                                                  Hey Dr.D you wanted me to obtain neuro consult pt states she didn't see a neurologist due to insurance issues pt states she saw a eye doctor and he told her she had migraine. Do you want me to refer pt to neurologist?

## 2011-07-31 ENCOUNTER — Encounter: Payer: Self-pay | Admitting: Obstetrics and Gynecology

## 2011-08-01 ENCOUNTER — Other Ambulatory Visit: Payer: Self-pay | Admitting: Obstetrics and Gynecology

## 2011-08-01 DIAGNOSIS — Z3689 Encounter for other specified antenatal screening: Secondary | ICD-10-CM

## 2011-08-05 ENCOUNTER — Ambulatory Visit (INDEPENDENT_AMBULATORY_CARE_PROVIDER_SITE_OTHER): Payer: Medicaid Other

## 2011-08-05 ENCOUNTER — Ambulatory Visit (INDEPENDENT_AMBULATORY_CARE_PROVIDER_SITE_OTHER): Payer: Medicaid Other | Admitting: Obstetrics and Gynecology

## 2011-08-05 VITALS — BP 100/62 | Wt 163.0 lb

## 2011-08-05 DIAGNOSIS — O139 Gestational [pregnancy-induced] hypertension without significant proteinuria, unspecified trimester: Secondary | ICD-10-CM

## 2011-08-05 DIAGNOSIS — O169 Unspecified maternal hypertension, unspecified trimester: Secondary | ICD-10-CM

## 2011-08-05 DIAGNOSIS — Z3689 Encounter for other specified antenatal screening: Secondary | ICD-10-CM

## 2011-08-05 NOTE — Progress Notes (Signed)
The patient is scheduled to see a neurologist about her headaches. Dr. Stefano Gaul

## 2011-08-05 NOTE — Progress Notes (Signed)
Pt states she has no concerns today.  

## 2011-08-05 NOTE — Progress Notes (Signed)
The patient has stopped taking her nifedipine.  Hypertension in pregnancy was discussed.  Chronic hypertension was discussed.  The patient was told that we will monitor her blood pressure for nail but that she may need to be restarted on her medication.  We will check baseline PIH labs and a baseline 24 urine sample for protein.  The patient will sign tubal papers today.  She desires sterilization.  Genetic testing was discussed.  The patient declined AFP and amniocentesis.  Ultrasound: Single gestation, normal fluid, normal anatomy, female, cervix 3.52 cm, 20 weeks (38th percentile). We'll repeat ultrasound in 4 weeks.  Dr. Stefano Gaul

## 2011-08-08 LAB — US OB COMP + 14 WK

## 2011-08-25 ENCOUNTER — Ambulatory Visit (INDEPENDENT_AMBULATORY_CARE_PROVIDER_SITE_OTHER): Payer: Medicaid Other | Admitting: Obstetrics and Gynecology

## 2011-08-25 VITALS — BP 100/62 | Wt 164.0 lb

## 2011-08-25 DIAGNOSIS — O26849 Uterine size-date discrepancy, unspecified trimester: Secondary | ICD-10-CM

## 2011-08-25 DIAGNOSIS — Z3490 Encounter for supervision of normal pregnancy, unspecified, unspecified trimester: Secondary | ICD-10-CM

## 2011-08-25 DIAGNOSIS — Z331 Pregnant state, incidental: Secondary | ICD-10-CM

## 2011-08-25 MED ORDER — PRENATAL VITAMINS (DIS) PO TABS: 1.0000 | ORAL_TABLET | Freq: Every day | ORAL | Status: DC

## 2011-08-25 NOTE — Progress Notes (Addendum)
PT has appt with neurologist today re: migraines and ?h/o CVA last year Pt will bring med for HSV 2 that she has at home nv U/S at [facility] secondary to S>D RTO 2wks (per AVS per pt) Rx PNV

## 2011-09-08 ENCOUNTER — Ambulatory Visit (INDEPENDENT_AMBULATORY_CARE_PROVIDER_SITE_OTHER): Payer: Medicaid Other

## 2011-09-08 ENCOUNTER — Encounter: Payer: Self-pay | Admitting: Obstetrics and Gynecology

## 2011-09-08 ENCOUNTER — Ambulatory Visit (INDEPENDENT_AMBULATORY_CARE_PROVIDER_SITE_OTHER): Payer: Medicaid Other | Admitting: Obstetrics and Gynecology

## 2011-09-08 VITALS — BP 102/62 | Wt 169.0 lb

## 2011-09-08 DIAGNOSIS — N898 Other specified noninflammatory disorders of vagina: Secondary | ICD-10-CM

## 2011-09-08 DIAGNOSIS — O26849 Uterine size-date discrepancy, unspecified trimester: Secondary | ICD-10-CM

## 2011-09-08 DIAGNOSIS — R42 Dizziness and giddiness: Secondary | ICD-10-CM

## 2011-09-08 DIAGNOSIS — G43909 Migraine, unspecified, not intractable, without status migrainosus: Secondary | ICD-10-CM

## 2011-09-08 LAB — CBC
MCH: 27.9 pg (ref 26.0–34.0)
MCV: 83.8 fL (ref 78.0–100.0)
Platelets: 161 10*3/uL (ref 150–400)
RBC: 3.76 MIL/uL — ABNORMAL LOW (ref 3.87–5.11)
RDW: 19.2 % — ABNORMAL HIGH (ref 11.5–15.5)
WBC: 8.6 10*3/uL (ref 4.0–10.5)

## 2011-09-08 LAB — POCT WET PREP (WET MOUNT)

## 2011-09-08 MED ORDER — TERCONAZOLE 0.4 % VA CREA: TOPICAL_CREAM | VAGINAL | Status: DC

## 2011-09-08 MED ORDER — MAGNESIUM OXIDE 400 MG PO TABS: 400.0000 mg | ORAL_TABLET | Freq: Every day | ORAL | Status: DC

## 2011-09-08 NOTE — Telephone Encounter (Signed)
done

## 2011-09-08 NOTE — Progress Notes (Signed)
Pt believes she has yeast infection. C/o discharge with odor.  Wet Prep:  Yeast Rx Terazol D/C'd procardia 2 months ago.  Has monitored BP at home with nl readings except when upset C/ o some dizzy spells but not asssociated with BP elelvations Saw Neurologist re migraines.  Magnesium oxide recommended.  Will f/u prn episode of vision loss in which case MRI will be encouraged  Ultrasound:Ultrasound shows:  SIUP  S=D     [place] EDD: 12/22/11  EFW 1lb 13 oz           AFI: nl           Cervical length: 3.38 cm           Placenta localization: posterior           Fetal presentation: cephalic                   Anatomy survey is normal           Gender : female

## 2011-09-09 LAB — US OB FOLLOW UP

## 2011-09-22 ENCOUNTER — Other Ambulatory Visit: Payer: Medicaid Other

## 2011-09-22 ENCOUNTER — Encounter: Payer: Self-pay | Admitting: Obstetrics and Gynecology

## 2011-09-22 ENCOUNTER — Ambulatory Visit (INDEPENDENT_AMBULATORY_CARE_PROVIDER_SITE_OTHER): Payer: Medicaid Other | Admitting: Obstetrics and Gynecology

## 2011-09-22 VITALS — BP 100/60 | Wt 174.0 lb

## 2011-09-22 DIAGNOSIS — Z3490 Encounter for supervision of normal pregnancy, unspecified, unspecified trimester: Secondary | ICD-10-CM

## 2011-09-22 DIAGNOSIS — Z331 Pregnant state, incidental: Secondary | ICD-10-CM

## 2011-09-22 LAB — HEMOGLOBIN: Hemoglobin: 10.9 g/dL — ABNORMAL LOW (ref 12.0–15.0)

## 2011-09-22 NOTE — Progress Notes (Signed)
[redacted]w[redacted]d Has stomach bug over the past few days. Minimal appetite. No change vaginal secretions. 1 hr GTT today.

## 2011-09-22 NOTE — Progress Notes (Signed)
C/o diarrhea since yesterday  1 gtt given without difficulty

## 2011-09-23 LAB — GLUCOSE TOLERANCE, 1 HOUR (50G) W/O FASTING: Glucose, 1 Hour GTT: 98 mg/dL (ref 70–140)

## 2011-10-06 ENCOUNTER — Encounter: Payer: Self-pay | Admitting: Obstetrics and Gynecology

## 2011-10-06 ENCOUNTER — Ambulatory Visit (INDEPENDENT_AMBULATORY_CARE_PROVIDER_SITE_OTHER): Payer: Medicaid Other | Admitting: Obstetrics and Gynecology

## 2011-10-06 VITALS — BP 104/64 | Wt 175.5 lb

## 2011-10-06 DIAGNOSIS — Z331 Pregnant state, incidental: Secondary | ICD-10-CM

## 2011-10-06 NOTE — Progress Notes (Signed)
Patient ID: [name], female   DOB: Jul 10, 1975, 36 y.o.   MRN: [id number] C/o feels like pelvis is stretching. Vag LTC. Reviewed s/s preterm labor, srom, vag bleeding,daily kick counts to report, encouraged 8 water daily and frequent voids. Lavera Guise, CNM

## 2011-10-22 ENCOUNTER — Other Ambulatory Visit: Payer: Self-pay | Admitting: Obstetrics and Gynecology

## 2011-10-22 ENCOUNTER — Encounter: Payer: Self-pay | Admitting: Obstetrics and Gynecology

## 2011-10-22 ENCOUNTER — Ambulatory Visit (INDEPENDENT_AMBULATORY_CARE_PROVIDER_SITE_OTHER): Payer: Medicaid Other | Admitting: Obstetrics and Gynecology

## 2011-10-22 VITALS — BP 100/66 | Wt 178.0 lb

## 2011-10-22 DIAGNOSIS — A609 Anogenital herpesviral infection, unspecified: Secondary | ICD-10-CM

## 2011-10-22 DIAGNOSIS — B009 Herpesviral infection, unspecified: Secondary | ICD-10-CM

## 2011-10-22 DIAGNOSIS — O26849 Uterine size-date discrepancy, unspecified trimester: Secondary | ICD-10-CM

## 2011-10-22 MED ORDER — VALACYCLOVIR HCL 500 MG PO TABS: 500.0000 mg | ORAL_TABLET | Freq: Every day | ORAL | Status: AC

## 2011-10-22 NOTE — Progress Notes (Signed)
Some contractions yesterday, very few today.  Also may have yeast or BV. Wet prep Hx chronic HTN, no meds since early pregnancy. Will check [place] for growth and fluid. Rx Valtrex 500 mg po q day for suppressive tx--to start by 34 weeks. 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  I will investigate how to get this ordered and f/u with patient.

## 2011-10-22 NOTE — Progress Notes (Signed)
Pt c/o more frequent & painful ctx's

## 2011-10-24 ENCOUNTER — Telehealth: Payer: Self-pay

## 2011-10-24 NOTE — Telephone Encounter (Signed)
Spoke with pt [name] refer to neurologist advised pr per [facility] pt to have f/u appt to eval migraines pt will call to schd appt

## 2011-10-24 NOTE — Telephone Encounter (Signed)
Message copied by Rolla Plate on Fri Oct 24, 2011  9:14 AM ------      Message from: Cornelius Moras      Created: Thu Oct 23, 2011  8:00 PM      Regarding: RE: Neuro referral       Please do--the notes from her visit don't note a f/u visit was planned, that's why I was confused.  She's still having the same issue--the question is whether she needs an MRI....that will be their call.      VL      ----- Message -----         From: Claudie Leach, CMA         Sent: 10/23/2011   8:10 AM           To: Nigel Bridgeman, CNM      Subject: RE: Neuro referral                                       Pt had a follow appt schd she didn't show up for her appt i can call the pt and have her call them to schd a f/u appt      ----- Message -----         From: Nigel Bridgeman, CNM         Sent: 10/22/2011   9:46 PM           To: Claudie Leach, CMA      Subject: RE: Neuro referral                                       Did you get a sense of whether they were willing to see her again?  According to the notes (and thanks for finding those), I didn't see a f/u appointment scheduled.            VL      ----- Message -----         From: Claudie Leach, CMA         Sent: 10/22/2011   4:04 PM           To: Nigel Bridgeman, CNM      Subject: RE: Neuro referral                                       I called [facility] pt had appt 08/25/11 they states pt didn't show up for follow up appt. Records faxed from 08/25/11 visit i will have medical records scan then into the system      ----- Message -----         From: Nigel Bridgeman, CNM         Sent: 10/22/2011   2:05 PM           To: Claudie Leach, CMA      Subject: Neuro referral                                           Patient has not had appt with [facility]--please call that office and make sure we can still do  the referral, then call patient and make sure she knows about the appt, or that they will call her to schedule.            Due to ? Ocular  migraines.      VL

## 2011-10-24 NOTE — Progress Notes (Signed)
Spoke with VL about possible MRI 10/23/11. States she will continue to investigate and notify and recommendation.

## 2011-10-27 ENCOUNTER — Other Ambulatory Visit: Payer: Self-pay | Admitting: Obstetrics and Gynecology

## 2011-10-27 MED ORDER — METRONIDAZOLE 500 MG PO TABS: 500.0000 mg | ORAL_TABLET | Freq: Two times a day (BID) | ORAL | Status: DC

## 2011-10-27 MED ORDER — TERCONAZOLE 0.4 % VA CREA: TOPICAL_CREAM | VAGINAL | Status: DC

## 2011-10-27 NOTE — Telephone Encounter (Signed)
Spoke with pt rgd msg pt states VL was to send rx flagyl to pharm for BV advised pt no note in chart about rx will consult with VL and call her back pt voice understanding

## 2011-10-27 NOTE — Telephone Encounter (Signed)
Consult with VL pt can have rx for terazol rx sent to pharm pt notified

## 2011-11-05 ENCOUNTER — Ambulatory Visit (INDEPENDENT_AMBULATORY_CARE_PROVIDER_SITE_OTHER): Payer: Medicaid Other | Admitting: Obstetrics and Gynecology

## 2011-11-05 ENCOUNTER — Ambulatory Visit (INDEPENDENT_AMBULATORY_CARE_PROVIDER_SITE_OTHER): Payer: Medicaid Other

## 2011-11-05 ENCOUNTER — Encounter: Payer: Self-pay | Admitting: Obstetrics and Gynecology

## 2011-11-05 VITALS — BP 110/70 | Wt 179.0 lb

## 2011-11-05 DIAGNOSIS — O26849 Uterine size-date discrepancy, unspecified trimester: Secondary | ICD-10-CM

## 2011-11-05 DIAGNOSIS — Z331 Pregnant state, incidental: Secondary | ICD-10-CM

## 2011-11-05 LAB — US OB FOLLOW UP

## 2011-11-05 NOTE — Progress Notes (Signed)
[redacted]w[redacted]d CHTN with no meds: NO Sx PIH Will initiate fetal testing

## 2011-11-05 NOTE — Progress Notes (Signed)
Sono:  EFW  5lb0oz  48.6%  CX  3.88cm  AFI 15.70cm   Vertex presentation, posterior placenta, AFI is normal (55th%tile)

## 2011-11-05 NOTE — Progress Notes (Signed)
Pt c/o shooting pain in right breast.

## 2011-11-11 ENCOUNTER — Other Ambulatory Visit: Payer: Self-pay | Admitting: Obstetrics and Gynecology

## 2011-11-11 ENCOUNTER — Ambulatory Visit (INDEPENDENT_AMBULATORY_CARE_PROVIDER_SITE_OTHER): Payer: Medicaid Other | Admitting: Obstetrics and Gynecology

## 2011-11-11 VITALS — BP 122/76

## 2011-11-11 DIAGNOSIS — O10919 Unspecified pre-existing hypertension complicating pregnancy, unspecified trimester: Secondary | ICD-10-CM

## 2011-11-11 DIAGNOSIS — O10019 Pre-existing essential hypertension complicating pregnancy, unspecified trimester: Secondary | ICD-10-CM

## 2011-11-11 LAB — LACTATE DEHYDROGENASE: LDH: 157 U/L (ref 94–250)

## 2011-11-11 LAB — COMPREHENSIVE METABOLIC PANEL
Albumin: 2.6 g/dL — ABNORMAL LOW (ref 3.5–5.2)
CO2: 26 mEq/L (ref 19–32)
Calcium: 9.4 mg/dL (ref 8.4–10.5)
Chloride: 102 mEq/L (ref 96–112)
Glucose, Bld: 84 mg/dL (ref 70–99)
Potassium: 3.9 mEq/L (ref 3.5–5.3)
Sodium: 137 mEq/L (ref 135–145)
Total Protein: 6.3 g/dL (ref 6.0–8.3)

## 2011-11-11 LAB — CBC
MCV: 88.4 fL (ref 78.0–100.0)
WBC: 5.2 10*3/uL (ref 4.0–10.5)

## 2011-11-11 LAB — URIC ACID: Uric Acid, Serum: 3.8 mg/dL (ref 2.4–7.0)

## 2011-11-11 NOTE — Progress Notes (Signed)
Pt  States is very fatigued. Having "wavy lines" in vision. Has appt with neurologist for vision loss.  Denies headaches. NST reactive per VL. Labs ordered. 24 hour urine for 11/14/11. Pt to call with any change in sx.

## 2011-11-14 ENCOUNTER — Encounter: Payer: Medicaid Other | Admitting: Obstetrics and Gynecology

## 2011-11-14 ENCOUNTER — Telehealth: Payer: Self-pay | Admitting: Obstetrics and Gynecology

## 2011-11-14 ENCOUNTER — Other Ambulatory Visit: Payer: Medicaid Other

## 2011-11-14 NOTE — Telephone Encounter (Signed)
Pt called, was unable to make appt today for NST/ROB d/t transportation issues.  Did not do 24 hour urine bc was @ work and did not feel comfortable with doing that.  Pt also states has not felt baby move as much today, but also admits to not eating or drinking as much today.  Consulted w/ DD, advised pt to come by MAU tomorrow for eval and to turn in 24 hour urine.  Also advised pt to make sure she eat/drink today and lie down to monitor baby's movements, if still not feeling as much movement advised to call DD this evening to let her know.  Pt voices agreement, will go to MAU tomorrow evening.

## 2011-11-15 ENCOUNTER — Encounter: Payer: Self-pay | Admitting: Obstetrics and Gynecology

## 2011-11-15 ENCOUNTER — Inpatient Hospital Stay (HOSPITAL_COMMUNITY)
Admission: AD | Admit: 2011-11-15 | Discharge: 2011-11-15 | Disposition: A | Discharge status: Home / Self Care | Payer: Medicaid Other | Source: Ambulatory Visit | Attending: Obstetrics and Gynecology | Admitting: Obstetrics and Gynecology

## 2011-11-15 DIAGNOSIS — F17200 Nicotine dependence, unspecified, uncomplicated: Secondary | ICD-10-CM

## 2011-11-15 DIAGNOSIS — D219 Benign neoplasm of connective and other soft tissue, unspecified: Secondary | ICD-10-CM

## 2011-11-15 DIAGNOSIS — K589 Irritable bowel syndrome without diarrhea: Secondary | ICD-10-CM

## 2011-11-15 DIAGNOSIS — O10019 Pre-existing essential hypertension complicating pregnancy, unspecified trimester: Secondary | ICD-10-CM | POA: Insufficient documentation

## 2011-11-15 DIAGNOSIS — Z8619 Personal history of other infectious and parasitic diseases: Secondary | ICD-10-CM

## 2011-11-15 DIAGNOSIS — Z8614 Personal history of Methicillin resistant Staphylococcus aureus infection: Secondary | ICD-10-CM

## 2011-11-15 DIAGNOSIS — O98519 Other viral diseases complicating pregnancy, unspecified trimester: Secondary | ICD-10-CM | POA: Insufficient documentation

## 2011-11-15 DIAGNOSIS — F1290 Cannabis use, unspecified, uncomplicated: Secondary | ICD-10-CM

## 2011-11-15 DIAGNOSIS — A6000 Herpesviral infection of urogenital system, unspecified: Secondary | ICD-10-CM | POA: Insufficient documentation

## 2011-11-15 LAB — PROTEIN, URINE, 24 HOUR: Protein, 24H Urine: 90 mg/d (ref 50–100)

## 2011-11-15 NOTE — MAU Note (Signed)
Pt presents to MAU for NST and drop off of 24 hour urine. Pt is [redacted]w[redacted]d, No complaints at this time.

## 2011-11-15 NOTE — MAU Provider Note (Signed)
History   This patient had missed her appointment on Friday due to car problems. Has completed 24 hr urine for protein and creatinine. Same submitted and sent to lab today.  CSN: [id number]  Arrival date and time: 11/15/11 1754   None     Chief Complaint  Patient presents with  . Non-stress Test   HPI  OB History    Grav Para Term Preterm Abortions TAB SAB Ect Mult Living   5 3 3  1     3       Past Medical History  Diagnosis Date  . GERD (gastroesophageal reflux disease)   . Complication of anesthesia     BAck pain after epidural 2007  . Heart palpitations     FEW X PER WEEK  . Depression 2000    MEDS IN PAST  . Abnormal Pap smear     COLPO;   LEEP 2003  . Infection     CHLAMYDIA?  Marland Kitchen Infection     HERPES  . Infection     TRICH  . Infection     YEAST; FREQUENT WITH NUIVAring  . Infection     UTI OCC  . High blood pressure 2012    ON MED  . Anemia HX  IN TEENS    ON FE  . Shortness of breath     FEW X WEEK  . Fibroid   . IBS (irritable bowel syndrome)   . Head injury AS TEEN  . 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  . Migraines     OCULAR MIGRAINES;  HAS HX LOSS FO VISION    Past Surgical History  Procedure Date  . Laparoscopy   . Tonsillectomy 2002  . Wisdom tooth extraction 2005  . Induced abortion 1999    Family History  Problem Relation Age of Onset  . Migraines Mother   . Heart disease Father     MI  . Mental illness Father   . Hypertension Maternal Aunt   . Cancer Maternal Aunt   . Hypertension Maternal Uncle   . Mental illness Paternal Aunt   . Mental illness Paternal Uncle   . Heart disease Paternal Uncle   . Thyroid disease Maternal Grandmother   . Kidney disease Maternal Grandmother     DIALYSIS  . Hypertension Maternal Grandmother   . Arthritis Maternal Grandmother   . Hypertension Maternal Grandfather   . Diabetes Maternal Grandfather   . Arthritis Maternal Grandfather   . Alcohol abuse  Maternal Grandfather   . Stroke Maternal Grandfather   . Mental illness Paternal Grandfather   . Cancer Cousin     breast  . Asthma Cousin   . Mental illness Cousin     History  Substance Use Topics  . Smoking status: Former Smoker    Types: Cigarettes    Quit date: 08/30/2010  . Smokeless tobacco: Never Used  . Alcohol Use: No     OCC    Allergies:  Allergies  Allergen Reactions  . Pollen Extract Itching    Itchy eyes,sneezing  . Shrimp (Shellfish Allergy) Other (See Comments)    Headache when pt eat too much  . Sucralose Nausea Only    Prescriptions prior to admission  Medication Sig Dispense Refill  . Prenatal Vit-Fe Fumarate-FA (PRENATAL MULTIVITAMIN) TABS Take 1 tablet by mouth daily.      . valACYclovir (VALTREX) 500 MG tablet Take 1 tablet (500 mg total) by mouth  daily.  30 tablet  2  . acyclovir (ZOVIRAX) 200 MG capsule Take by mouth every 4 (four) hours while awake.      . metroNIDAZOLE (FLAGYL) 500 MG tablet Take 1 tablet (500 mg total) by mouth 2 (two) times daily with a meal.  14 tablet  0  . terconazole (TERAZOL 7) 0.4 % vaginal cream Pt to insert 1 applicator full pv qhs x 7days  45 g  0    Review of Systems  Constitutional: Negative.   HENT:       Ocular migraine Has appointment to see the Neurologist 12/11/11   Eyes: Negative.   Cardiovascular: Negative.   Gastrointestinal: Negative.   Genitourinary: Negative.   Musculoskeletal: Negative.   Skin: Negative.   Neurological: Positive for headaches.  Endo/Heme/Allergies: Negative.   Psychiatric/Behavioral: Negative.    Physical Exam   Blood pressure 123/71, pulse 66, resp. rate 18, last menstrual period 03/15/2011.  Physical Exam  Constitutional: She is oriented to person, place, and time. She appears well-developed and well-nourished.  HENT:  Head: Atraumatic.  Eyes: Conjunctivae normal and EOM are normal. Pupils are equal, round, and reactive to light.  Neck: Normal range of motion. Neck  supple.  Cardiovascular: Normal rate and regular rhythm.   Respiratory: Effort normal and breath sounds normal.  GI: Soft. Bowel sounds are normal.  Genitourinary: Vagina normal.       Braxton Hicks Contractions  Pateint is aware of some contractions but has no distress with same.  Musculoskeletal: Normal range of motion.  Neurological: She is alert and oriented to person, place, and time. She has normal reflexes.  Skin: Skin is warm and dry.  Psychiatric: She has a normal mood and affect.    MAU Course  Procedures  24  Hr urine has been sent to lab,. NST: baseline 135 bpm cat 1 reactive tracing  Assessment and Plan  24 hr urine to lab for baseline  Protein and Creatinine  Appointment to see the Neurologist 12/11/11 To schedule and appointment for [facility] on Monday.   Dearies Meikle, CNM. 11/15/2011, 6:41 PM

## 2011-11-17 ENCOUNTER — Telehealth: Payer: Self-pay | Admitting: Obstetrics and Gynecology

## 2011-11-18 ENCOUNTER — Other Ambulatory Visit: Payer: Medicaid Other

## 2011-11-18 ENCOUNTER — Ambulatory Visit (INDEPENDENT_AMBULATORY_CARE_PROVIDER_SITE_OTHER): Payer: Medicaid Other | Admitting: Obstetrics and Gynecology

## 2011-11-18 VITALS — BP 110/64 | Wt 180.0 lb

## 2011-11-18 DIAGNOSIS — I10 Essential (primary) hypertension: Secondary | ICD-10-CM

## 2011-11-18 NOTE — Progress Notes (Signed)
[redacted]w[redacted]d Pt sees neurologist again the end of the month NST reactive Pt not taking any htn med or "oxide" med she was rx'd by the neurologist She is taking valtrex (or acyclovir) for suppression and is s/p an outbreak 2wks ago GBS at [place] Will cont antepartum testing but pt only wants to come once a week so will schedule weekly BPP 24hr urine was wnl and labs were wnl as well RTO 1wk FKCs and Labor Precautions

## 2011-11-26 ENCOUNTER — Ambulatory Visit (INDEPENDENT_AMBULATORY_CARE_PROVIDER_SITE_OTHER): Payer: Medicaid Other | Admitting: Obstetrics and Gynecology

## 2011-11-26 ENCOUNTER — Other Ambulatory Visit: Payer: Self-pay | Admitting: Obstetrics and Gynecology

## 2011-11-26 ENCOUNTER — Encounter: Payer: Self-pay | Admitting: Obstetrics and Gynecology

## 2011-11-26 ENCOUNTER — Ambulatory Visit (INDEPENDENT_AMBULATORY_CARE_PROVIDER_SITE_OTHER): Payer: Medicaid Other

## 2011-11-26 VITALS — BP 110/76 | Wt 181.0 lb

## 2011-11-26 DIAGNOSIS — I10 Essential (primary) hypertension: Secondary | ICD-10-CM

## 2011-11-26 DIAGNOSIS — O169 Unspecified maternal hypertension, unspecified trimester: Secondary | ICD-10-CM

## 2011-11-26 DIAGNOSIS — Z331 Pregnant state, incidental: Secondary | ICD-10-CM

## 2011-11-26 DIAGNOSIS — B49 Unspecified mycosis: Secondary | ICD-10-CM

## 2011-11-26 DIAGNOSIS — N898 Other specified noninflammatory disorders of vagina: Secondary | ICD-10-CM

## 2011-11-26 DIAGNOSIS — B379 Candidiasis, unspecified: Secondary | ICD-10-CM

## 2011-11-26 LAB — POCT WET PREP (WET MOUNT): Whiff Test: NEGATIVE

## 2011-11-26 NOTE — Progress Notes (Signed)
Pt thinks she may have yeast infection °

## 2011-11-26 NOTE — Progress Notes (Signed)
[redacted]w[redacted]d Beta strep, GC, Chlamydia sent. Wet prep: Negative (patient complains of yeast infection) Ultrasound: Single gestation, normal fluid, 6 lbs. 6 oz. (45th percentile), vertex, biophysical profile 8 out of 8. No HSV lesions or symptoms. Return to office in 1 week. Biophysical profile next week because of a history of hypertension. Dr. Stefano Gaul

## 2011-11-27 LAB — GC/CHLAMYDIA PROBE AMP, GENITAL
Chlamydia, DNA Probe: NEGATIVE
GC Probe Amp, Genital: NEGATIVE

## 2011-11-28 LAB — STREP B DNA PROBE: GBSP: NEGATIVE

## 2011-12-01 LAB — US OB FOLLOW UP

## 2011-12-03 ENCOUNTER — Ambulatory Visit (INDEPENDENT_AMBULATORY_CARE_PROVIDER_SITE_OTHER): Payer: Medicaid Other | Admitting: Obstetrics and Gynecology

## 2011-12-03 ENCOUNTER — Encounter: Payer: Self-pay | Admitting: Obstetrics and Gynecology

## 2011-12-03 ENCOUNTER — Ambulatory Visit (INDEPENDENT_AMBULATORY_CARE_PROVIDER_SITE_OTHER): Payer: Medicaid Other

## 2011-12-03 ENCOUNTER — Other Ambulatory Visit: Payer: Self-pay

## 2011-12-03 VITALS — BP 118/72 | Wt 184.0 lb

## 2011-12-03 DIAGNOSIS — O169 Unspecified maternal hypertension, unspecified trimester: Secondary | ICD-10-CM

## 2011-12-03 DIAGNOSIS — I10 Essential (primary) hypertension: Secondary | ICD-10-CM

## 2011-12-03 DIAGNOSIS — O368190 Decreased fetal movements, unspecified trimester, not applicable or unspecified: Secondary | ICD-10-CM

## 2011-12-03 NOTE — Progress Notes (Signed)
[redacted]w[redacted]d No complaints FKCs and Labor Precautions BPP 6/8 and NST nonreactive - rec prolonged monitoring +/- repeat BPP U/S nl fluid, vtx, post placenta, BPP again 6/8 for total of 6/10, AFI 16cm I rec admission to hosp for obs overnight with repeat BPP in am  FKC NST still had not met criteria for reactivity even after prolonged monitorin If gets d/c'd from hosp, rto 1wk for ROB and BPP.

## 2011-12-04 ENCOUNTER — Other Ambulatory Visit: Payer: Self-pay | Admitting: Obstetrics and Gynecology

## 2011-12-04 ENCOUNTER — Ambulatory Visit (INDEPENDENT_AMBULATORY_CARE_PROVIDER_SITE_OTHER): Payer: Medicaid Other

## 2011-12-04 ENCOUNTER — Observation Stay (HOSPITAL_COMMUNITY)
Admission: AD | Admit: 2011-12-04 | Discharge: 2011-12-04 | DRG: 781 | Disposition: A | Discharge status: Home / Self Care | Payer: Medicaid Other | Source: Ambulatory Visit | Attending: Obstetrics and Gynecology | Admitting: Obstetrics and Gynecology

## 2011-12-04 ENCOUNTER — Other Ambulatory Visit: Payer: Self-pay

## 2011-12-04 ENCOUNTER — Inpatient Hospital Stay (HOSPITAL_COMMUNITY): Payer: Medicaid Other

## 2011-12-04 DIAGNOSIS — R9389 Abnormal findings on diagnostic imaging of other specified body structures: Secondary | ICD-10-CM

## 2011-12-04 DIAGNOSIS — O368190 Decreased fetal movements, unspecified trimester, not applicable or unspecified: Secondary | ICD-10-CM

## 2011-12-04 DIAGNOSIS — Z8614 Personal history of Methicillin resistant Staphylococcus aureus infection: Secondary | ICD-10-CM

## 2011-12-04 DIAGNOSIS — O368390 Maternal care for abnormalities of the fetal heart rate or rhythm, unspecified trimester, not applicable or unspecified: Secondary | ICD-10-CM

## 2011-12-04 DIAGNOSIS — O10919 Unspecified pre-existing hypertension complicating pregnancy, unspecified trimester: Secondary | ICD-10-CM

## 2011-12-04 DIAGNOSIS — Z8619 Personal history of other infectious and parasitic diseases: Secondary | ICD-10-CM

## 2011-12-04 DIAGNOSIS — O10019 Pre-existing essential hypertension complicating pregnancy, unspecified trimester: Principal | ICD-10-CM | POA: Diagnosis present

## 2011-12-04 DIAGNOSIS — D219 Benign neoplasm of connective and other soft tissue, unspecified: Secondary | ICD-10-CM

## 2011-12-04 DIAGNOSIS — Z331 Pregnant state, incidental: Secondary | ICD-10-CM

## 2011-12-04 DIAGNOSIS — F1290 Cannabis use, unspecified, uncomplicated: Secondary | ICD-10-CM

## 2011-12-04 DIAGNOSIS — K589 Irritable bowel syndrome without diarrhea: Secondary | ICD-10-CM

## 2011-12-04 DIAGNOSIS — F17200 Nicotine dependence, unspecified, uncomplicated: Secondary | ICD-10-CM

## 2011-12-04 MED ORDER — ACETAMINOPHEN 325 MG PO TABS: 650.0000 mg | ORAL_TABLET | ORAL | Status: DC | PRN

## 2011-12-04 MED ORDER — LACTATED RINGERS IV SOLN
INTRAVENOUS | Status: DC
  Administered 2011-12-04: 04:00:00 via INTRAVENOUS

## 2011-12-04 MED ORDER — ZOLPIDEM TARTRATE 5 MG PO TABS: 5.0000 mg | ORAL_TABLET | Freq: Every evening | ORAL | Status: DC | PRN

## 2011-12-04 MED ORDER — LACTATED RINGERS IV BOLUS (SEPSIS)
500.0000 mL | Freq: Once | INTRAVENOUS | Status: AC
  Administered 2011-12-04: 500 mL via INTRAVENOUS

## 2011-12-04 MED ORDER — CALCIUM CARBONATE ANTACID 500 MG PO CHEW: 2.0000 | CHEWABLE_TABLET | ORAL | Status: DC | PRN

## 2011-12-04 MED ORDER — DOCUSATE SODIUM 100 MG PO CAPS
100.0000 mg | ORAL_CAPSULE | Freq: Every day | ORAL | Status: DC
  Administered 2011-12-04: 100 mg via ORAL
  Filled 2011-12-04: qty 1

## 2011-12-04 MED ORDER — PRENATAL MULTIVITAMIN CH
1.0000 | ORAL_TABLET | Freq: Every day | ORAL | Status: DC
  Administered 2011-12-04: 1 via ORAL
  Filled 2011-12-04: qty 1

## 2011-12-04 NOTE — Discharge Summary (Signed)
Physician Discharge Summary  Patient ID: [name] MRN: [id number] DOB/AGE: 1975-09-02 36 y.o.  Admit date: 12/04/2011 Discharge date: 12/04/2011  Admission Diagnoses: 37 +wks with borderline testing  Discharge Diagnoses: 37 + wks with reassuring fetal testing Active Problems:  * No active hospital problems. *    Discharged Condition: good  Hospital Course: Pt admitted for observation and repeat BPP secondary to BPP yest of 6/10.  BPP today is 8/8 with times of reactivity on tracing and no deceleration.    Consults: None  Significant Diagnostic Studies: U/S as above  Treatments: observation and hydration  Discharge Exam: Blood pressure 134/78, pulse 69, temperature 98.1 F (36.7 C), temperature source Oral, resp. rate 20, height 5\' 4"  (1.626 m), weight 181 lb (82.101 kg), last menstrual period 03/15/2011. General appearance: alert and no distress Resp: clear to auscultation bilaterally Cardio: regular rate and rhythm Extremities: no calf tenderness  Disposition: 01-Home or Self Care     Medication List     As of 12/04/2011 12:08 PM    TAKE these medications         prenatal multivitamin Tabs   Take 1 tablet by mouth daily.      valACYclovir 500 MG tablet   Commonly known as: VALTREX   Take 500 mg by mouth 2 (two) times daily.           Follow-up Information    Follow up with Purcell Nails, MD. (Monday for BPP and visit)    Contact information:   [address] [place] [address] [phone]          Signed: Purcell Nails 12/04/2011, 12:08 PM

## 2011-12-04 NOTE — H&P (Signed)
[name] is a 36 y.o. female presenting for antenatal testing, with" uc as always, no srom, no vag bleeding, +FM, HSV symptoms, plans tubal" 10 3/7 week [place] agrees with LMP EDC,   12/03/2011 BPP 6/8 and NST nonreactive - rec prolonged monitoring +/- repeat BPP  U/S nl fluid, vtx, post placenta, BPP again 6/8 for total of 6/10, AFI 16cm   Meds PNV, valtrex 500 mg po daily   History OB History    Grav Para Term Preterm Abortions TAB SAB Ect Mult Living   5 3 3  1     3      Past Medical History  Diagnosis Date  . GERD (gastroesophageal reflux disease)   . Complication of anesthesia     BAck pain after epidural 2007  . Heart palpitations     FEW X PER WEEK  . Depression 2000    MEDS IN PAST  . Abnormal Pap smear     COLPO;   LEEP 2003  . Infection     CHLAMYDIA?  [name] Kitchen Infection     HERPES  . Infection     TRICH  . Infection     YEAST; FREQUENT WITH NUIVAring  . Infection     UTI OCC  . High blood pressure 2012    ON MED  . Anemia HX  IN TEENS    ON FE  . Shortness of breath     FEW X WEEK  . Fibroid   . IBS (irritable bowel syndrome)   . Head injury AS TEEN  . 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  . Migraines     OCULAR MIGRAINES;  HAS HX LOSS FO VISION   Past Surgical History  Procedure Date  . Laparoscopy   . Tonsillectomy 2002  . Wisdom tooth extraction 2005  . Induced abortion 1999   Family History: family history includes Alcohol abuse in her maternal grandfather; Arthritis in her maternal grandfather and maternal grandmother; Asthma in her cousin; Cancer in her cousin and maternal aunt; Diabetes in her maternal grandfather; Heart disease in her father and paternal uncle; Hypertension in her maternal aunt, maternal grandfather, maternal grandmother, and maternal uncle; Kidney disease in her maternal grandmother; Mental illness in her cousin, father, paternal aunt, paternal grandfather, and paternal uncle; Migraines in her  mother; Stroke in her maternal grandfather; and Thyroid disease in her maternal grandmother. Social History:  reports that she quit smoking about 15 months ago. Her smoking use included Cigarettes. She has never used smokeless tobacco. She reports that she does not drink alcohol or use illicit drugs.   Prenatal Transfer Tool  Maternal Diabetes: No Genetic Screening: Declined Maternal Ultrasounds/Referrals: Abnormal:  Findings:   Other: 0/23/2013 BPP 6/8 and NST nonreactive - rec prolonged monitoring +/- repeat BPP  U/S nl fluid, vtx, post placenta, BPP again 6/8 for total of 6/10, AFI 16cm    Fetal Ultrasounds or other Referrals:  None Maternal Substance Abuse:  No Significant Maternal Medications:  None Significant Maternal Lab Results:  None Other Comments:  None  ROS    Last menstrual period 03/15/2011. Exam Physical Exam  Calm, no distress, cooperative, HEENT WNL grossly,  lungs clear bilaterally, AP RRR,  abd soft nt,no masses, not tympanic bowel sounds active,  DTRS bilaterally + 2 bilaterally, no clonus trace edema to lower extremities Fhts 130-140s LTV min UC q q 4-5 mild Prenatal labs: ABO, Rh: O/POS/-- (05/20 1022) Antibody: NEG (05/20  1022) Rubella: 85.6 (05/20 1022) RPR: NON REAC (08/12 1233)  HBsAg: NEGATIVE (05/20 1022)  HIV: NON REACTIVE (05/20 1022)  GBS: Neg GC/CHL neg/neg  Assessment/Plan: [redacted]w[redacted]d CHTN not on meds HSV hx Plans BTL consent in chart nonreassuring antenatal testing Continuous EFM, IV LR, BPP in am, collaboration with Dr. Normand Sloop at [facility].   Damar Petit 12/04/2011, 3:19 AM

## 2011-12-04 NOTE — MAU Note (Signed)
PT  HAS ARRIVED- WAS SUPPOSE TO COME FROM OFFICE  TO ANTENATAL- BECAUSE OF BPP.  - IS WAITING  FOR  ANTE ROOM.

## 2011-12-05 LAB — US FETAL BPP WO NON STRESS

## 2011-12-08 ENCOUNTER — Encounter: Payer: Medicaid Other | Admitting: Obstetrics and Gynecology

## 2011-12-08 ENCOUNTER — Other Ambulatory Visit: Payer: Medicaid Other

## 2011-12-08 ENCOUNTER — Telehealth: Payer: Self-pay

## 2011-12-08 NOTE — Telephone Encounter (Signed)
LM for pt to cb re: scheduling of repeat BPP and MD follow up. KNP tried to reach her to schedule last week, but hasn't heard back from pt. Melody Comas A

## 2011-12-09 ENCOUNTER — Ambulatory Visit (INDEPENDENT_AMBULATORY_CARE_PROVIDER_SITE_OTHER): Payer: Medicaid Other

## 2011-12-09 ENCOUNTER — Other Ambulatory Visit: Payer: Self-pay | Admitting: Obstetrics and Gynecology

## 2011-12-09 ENCOUNTER — Telehealth: Payer: Self-pay | Admitting: Obstetrics and Gynecology

## 2011-12-09 ENCOUNTER — Ambulatory Visit (INDEPENDENT_AMBULATORY_CARE_PROVIDER_SITE_OTHER): Payer: Medicaid Other | Admitting: Obstetrics and Gynecology

## 2011-12-09 VITALS — BP 110/62 | Wt 184.0 lb

## 2011-12-09 DIAGNOSIS — O368190 Decreased fetal movements, unspecified trimester, not applicable or unspecified: Secondary | ICD-10-CM

## 2011-12-09 DIAGNOSIS — K589 Irritable bowel syndrome without diarrhea: Secondary | ICD-10-CM

## 2011-12-09 DIAGNOSIS — Z8614 Personal history of Methicillin resistant Staphylococcus aureus infection: Secondary | ICD-10-CM

## 2011-12-09 DIAGNOSIS — F1210 Cannabis abuse, uncomplicated: Secondary | ICD-10-CM

## 2011-12-09 DIAGNOSIS — F1290 Cannabis use, unspecified, uncomplicated: Secondary | ICD-10-CM

## 2011-12-09 DIAGNOSIS — F17200 Nicotine dependence, unspecified, uncomplicated: Secondary | ICD-10-CM

## 2011-12-09 DIAGNOSIS — Z8619 Personal history of other infectious and parasitic diseases: Secondary | ICD-10-CM

## 2011-12-09 DIAGNOSIS — D259 Leiomyoma of uterus, unspecified: Secondary | ICD-10-CM

## 2011-12-09 DIAGNOSIS — I10 Essential (primary) hypertension: Secondary | ICD-10-CM

## 2011-12-09 DIAGNOSIS — D219 Benign neoplasm of connective and other soft tissue, unspecified: Secondary | ICD-10-CM

## 2011-12-09 NOTE — Progress Notes (Signed)
[redacted]w[redacted]d  U/S: CHTN  Vertex presentation, posterior placenta, AFI is Normal (65TH%) BPP = 8/8  C/O: pain while breathing and coughing.   Pt desires cervix check today.

## 2011-12-09 NOTE — Telephone Encounter (Signed)
Tc to pt per telephone call. Pt informed needs an appt for eval. Pt was sched for a  BPP and ROB on yesterday; however pt states,"was not aware of appts". Pt with transportation difficulty and request information rgdg this. P[facility] to [employer]). Sharyn Creamer will call pt to give information to have transportation set up. Pt to call back today to follow up if able to come to office for eval today. Pt with appt on hold for BPP today @ 4:00p and fu with AVS. Pt agrees.

## 2011-12-09 NOTE — Progress Notes (Signed)
[redacted]w[redacted]d Complains of back strain.  Chest is clear.  Heart regular rate and rhythm. Cervical check. Ultrasound: Biophysical profile 8 out of 8, single gestation, vertex, normal fluid. Return to office in 1 week. Biophysical profile next week because of a history of hypertension Dr. Stefano Gaul

## 2011-12-10 NOTE — Telephone Encounter (Signed)
Appt sched today for u/s @ 4:00p and fu with avs per Kaleen Mask).

## 2011-12-15 ENCOUNTER — Encounter: Payer: Medicaid Other | Admitting: Obstetrics and Gynecology

## 2011-12-15 ENCOUNTER — Ambulatory Visit (INDEPENDENT_AMBULATORY_CARE_PROVIDER_SITE_OTHER): Payer: Medicaid Other | Admitting: Obstetrics and Gynecology

## 2011-12-15 ENCOUNTER — Ambulatory Visit (INDEPENDENT_AMBULATORY_CARE_PROVIDER_SITE_OTHER): Payer: Medicaid Other

## 2011-12-15 VITALS — BP 120/80 | Wt 185.0 lb

## 2011-12-15 DIAGNOSIS — I10 Essential (primary) hypertension: Secondary | ICD-10-CM

## 2011-12-15 DIAGNOSIS — O10919 Unspecified pre-existing hypertension complicating pregnancy, unspecified trimester: Secondary | ICD-10-CM

## 2011-12-15 DIAGNOSIS — O10019 Pre-existing essential hypertension complicating pregnancy, unspecified trimester: Secondary | ICD-10-CM

## 2011-12-15 NOTE — Progress Notes (Signed)
[redacted]w[redacted]d  Pt desires cervix check today.  U/S BPP S/D  Vertex presentation Posterir placenta Normal fluid: AFI of 18.6cm = 75th%tile  Score BPP 8 of 8 in 9 minutes.

## 2011-12-15 NOTE — Progress Notes (Signed)
[redacted]w[redacted]d Ultrasound: Single gestation, vertex, normal fluid (75th percentile), biophysical profile 8 out 8.  (History of chronic hypertension). Return to office in 1 week. Consider induction at 41 weeks because blood pressure is stable and cervix is unfavorable at this point. Biophysical profile next week. No signs or symptoms of herpes outbreak.  No lesions on exam. Dr. Stefano Gaul

## 2011-12-19 ENCOUNTER — Encounter (HOSPITAL_COMMUNITY): Payer: Self-pay | Admitting: Anesthesiology

## 2011-12-19 ENCOUNTER — Inpatient Hospital Stay (HOSPITAL_COMMUNITY)
Admission: AD | Admit: 2011-12-19 | Discharge: 2011-12-21 | DRG: 774 | Disposition: A | Discharge status: Home / Self Care | Payer: Medicaid Other | Source: Ambulatory Visit | Attending: Obstetrics and Gynecology | Admitting: Obstetrics and Gynecology

## 2011-12-19 ENCOUNTER — Inpatient Hospital Stay (HOSPITAL_COMMUNITY): Payer: Medicaid Other | Admitting: Anesthesiology

## 2011-12-19 ENCOUNTER — Encounter (HOSPITAL_COMMUNITY): Payer: Self-pay

## 2011-12-19 ENCOUNTER — Telehealth: Payer: Self-pay | Admitting: Obstetrics and Gynecology

## 2011-12-19 DIAGNOSIS — A6000 Herpesviral infection of urogenital system, unspecified: Secondary | ICD-10-CM | POA: Diagnosis present

## 2011-12-19 DIAGNOSIS — O9902 Anemia complicating childbirth: Secondary | ICD-10-CM | POA: Diagnosis present

## 2011-12-19 DIAGNOSIS — D509 Iron deficiency anemia, unspecified: Secondary | ICD-10-CM | POA: Diagnosis present

## 2011-12-19 DIAGNOSIS — I10 Essential (primary) hypertension: Secondary | ICD-10-CM | POA: Diagnosis present

## 2011-12-19 DIAGNOSIS — D696 Thrombocytopenia, unspecified: Secondary | ICD-10-CM | POA: Diagnosis present

## 2011-12-19 DIAGNOSIS — B009 Herpesviral infection, unspecified: Secondary | ICD-10-CM | POA: Diagnosis present

## 2011-12-19 DIAGNOSIS — O9912 Other diseases of the blood and blood-forming organs and certain disorders involving the immune mechanism complicating childbirth: Secondary | ICD-10-CM | POA: Diagnosis present

## 2011-12-19 DIAGNOSIS — D689 Coagulation defect, unspecified: Secondary | ICD-10-CM | POA: Diagnosis present

## 2011-12-19 DIAGNOSIS — F17200 Nicotine dependence, unspecified, uncomplicated: Secondary | ICD-10-CM

## 2011-12-19 DIAGNOSIS — D649 Anemia, unspecified: Secondary | ICD-10-CM | POA: Diagnosis present

## 2011-12-19 DIAGNOSIS — F1290 Cannabis use, unspecified, uncomplicated: Secondary | ICD-10-CM

## 2011-12-19 DIAGNOSIS — O98519 Other viral diseases complicating pregnancy, unspecified trimester: Secondary | ICD-10-CM | POA: Diagnosis present

## 2011-12-19 DIAGNOSIS — F329 Major depressive disorder, single episode, unspecified: Secondary | ICD-10-CM | POA: Diagnosis present

## 2011-12-19 DIAGNOSIS — O09529 Supervision of elderly multigravida, unspecified trimester: Secondary | ICD-10-CM | POA: Diagnosis present

## 2011-12-19 DIAGNOSIS — O1002 Pre-existing essential hypertension complicating childbirth: Secondary | ICD-10-CM | POA: Diagnosis present

## 2011-12-19 DIAGNOSIS — D219 Benign neoplasm of connective and other soft tissue, unspecified: Secondary | ICD-10-CM

## 2011-12-19 DIAGNOSIS — K589 Irritable bowel syndrome without diarrhea: Secondary | ICD-10-CM

## 2011-12-19 DIAGNOSIS — Z8614 Personal history of Methicillin resistant Staphylococcus aureus infection: Secondary | ICD-10-CM

## 2011-12-19 DIAGNOSIS — Z8619 Personal history of other infectious and parasitic diseases: Secondary | ICD-10-CM

## 2011-12-19 DIAGNOSIS — F32A Depression, unspecified: Secondary | ICD-10-CM | POA: Diagnosis present

## 2011-12-19 LAB — CBC
Hemoglobin: 13.4 g/dL (ref 12.0–15.0)
MCH: 30.9 pg (ref 26.0–34.0)
MCV: 91 fL (ref 78.0–100.0)
Platelets: 121 10*3/uL — ABNORMAL LOW (ref 150–400)
RBC: 4.33 MIL/uL (ref 3.87–5.11)
WBC: 8.9 10*3/uL (ref 4.0–10.5)

## 2011-12-19 MED ORDER — FENTANYL 2.5 MCG/ML BUPIVACAINE 1/10 % EPIDURAL INFUSION (WH - ANES)
14.0000 mL/h | INTRAMUSCULAR | Status: DC
  Administered 2011-12-19: 14 mL/h via EPIDURAL
  Filled 2011-12-19: qty 125

## 2011-12-19 MED ORDER — ONDANSETRON HCL 4 MG/2ML IJ SOLN: 4.0000 mg | INTRAMUSCULAR | Status: DC | PRN

## 2011-12-19 MED ORDER — BENZOCAINE-MENTHOL 20-0.5 % EX AERO: 1.0000 "application " | INHALATION_SPRAY | CUTANEOUS | Status: DC | PRN

## 2011-12-19 MED ORDER — PRENATAL MULTIVITAMIN CH
1.0000 | ORAL_TABLET | Freq: Every day | ORAL | Status: DC
  Administered 2011-12-20 – 2011-12-21 (×2): 1 via ORAL
  Filled 2011-12-19 (×2): qty 1

## 2011-12-19 MED ORDER — LACTATED RINGERS IV SOLN
500.0000 mL | Freq: Once | INTRAVENOUS | Status: AC
  Administered 2011-12-19: 17:00:00 via INTRAVENOUS

## 2011-12-19 MED ORDER — WITCH HAZEL-GLYCERIN EX PADS: 1.0000 "application " | MEDICATED_PAD | CUTANEOUS | Status: DC | PRN

## 2011-12-19 MED ORDER — LIDOCAINE HCL (PF) 1 % IJ SOLN
30.0000 mL | INTRAMUSCULAR | Status: DC | PRN
  Administered 2011-12-19: 30 mL via SUBCUTANEOUS
  Filled 2011-12-19: qty 30

## 2011-12-19 MED ORDER — DIPHENHYDRAMINE HCL 25 MG PO CAPS: 25.0000 mg | ORAL_CAPSULE | Freq: Four times a day (QID) | ORAL | Status: DC | PRN

## 2011-12-19 MED ORDER — TETANUS-DIPHTH-ACELL PERTUSSIS 5-2.5-18.5 LF-MCG/0.5 IM SUSP: 0.5000 mL | Freq: Once | INTRAMUSCULAR | Status: DC

## 2011-12-19 MED ORDER — LANOLIN HYDROUS EX OINT: TOPICAL_OINTMENT | CUTANEOUS | Status: DC | PRN

## 2011-12-19 MED ORDER — LACTATED RINGERS IV SOLN: 500.0000 mL | INTRAVENOUS | Status: DC | PRN

## 2011-12-19 MED ORDER — ONDANSETRON HCL 4 MG PO TABS: 4.0000 mg | ORAL_TABLET | ORAL | Status: DC | PRN

## 2011-12-19 MED ORDER — ONDANSETRON HCL 4 MG/2ML IJ SOLN
4.0000 mg | Freq: Four times a day (QID) | INTRAMUSCULAR | Status: DC | PRN
  Administered 2011-12-19: 4 mg via INTRAVENOUS
  Filled 2011-12-19: qty 2

## 2011-12-19 MED ORDER — DIBUCAINE 1 % RE OINT: 1.0000 "application " | TOPICAL_OINTMENT | RECTAL | Status: DC | PRN

## 2011-12-19 MED ORDER — OXYCODONE-ACETAMINOPHEN 5-325 MG PO TABS
1.0000 | ORAL_TABLET | ORAL | Status: DC | PRN
  Administered 2011-12-20 – 2011-12-21 (×2): 1 via ORAL
  Filled 2011-12-19 (×2): qty 1

## 2011-12-19 MED ORDER — LIDOCAINE HCL (PF) 1 % IJ SOLN
INTRAMUSCULAR | Status: DC | PRN
  Administered 2011-12-19 (×4): 4 mL

## 2011-12-19 MED ORDER — SENNOSIDES-DOCUSATE SODIUM 8.6-50 MG PO TABS
2.0000 | ORAL_TABLET | Freq: Every day | ORAL | Status: DC
  Administered 2011-12-19 – 2011-12-20 (×2): 2 via ORAL

## 2011-12-19 MED ORDER — OXYCODONE-ACETAMINOPHEN 5-325 MG PO TABS: 1.0000 | ORAL_TABLET | ORAL | Status: DC | PRN

## 2011-12-19 MED ORDER — ACETAMINOPHEN 500 MG PO TABS: 1000.0000 mg | ORAL_TABLET | ORAL | Status: DC | PRN

## 2011-12-19 MED ORDER — IBUPROFEN 600 MG PO TABS: 600.0000 mg | ORAL_TABLET | Freq: Four times a day (QID) | ORAL | Status: DC | PRN

## 2011-12-19 MED ORDER — OXYTOCIN BOLUS FROM INFUSION: 500.0000 mL | INTRAVENOUS | Status: DC

## 2011-12-19 MED ORDER — OXYTOCIN 40 UNITS IN LACTATED RINGERS INFUSION - SIMPLE MED
62.5000 mL/h | INTRAVENOUS | Status: DC
  Administered 2011-12-19 (×2): 62.5 mL/h via INTRAVENOUS
  Filled 2011-12-19: qty 1000

## 2011-12-19 MED ORDER — CITRIC ACID-SODIUM CITRATE 334-500 MG/5ML PO SOLN: 30.0000 mL | ORAL | Status: DC | PRN

## 2011-12-19 MED ORDER — FAMOTIDINE 20 MG PO TABS: 40.0000 mg | ORAL_TABLET | Freq: Once | ORAL | Status: DC

## 2011-12-19 MED ORDER — LACTATED RINGERS IV SOLN
INTRAVENOUS | Status: DC
  Administered 2011-12-19: 17:00:00 via INTRAVENOUS

## 2011-12-19 MED ORDER — MEASLES, MUMPS & RUBELLA VAC ~~LOC~~ INJ: 0.5000 mL | INJECTION | Freq: Once | SUBCUTANEOUS | Status: DC

## 2011-12-19 MED ORDER — ZOLPIDEM TARTRATE 5 MG PO TABS: 5.0000 mg | ORAL_TABLET | Freq: Every evening | ORAL | Status: DC | PRN

## 2011-12-19 MED ORDER — LACTATED RINGERS IV SOLN: INTRAVENOUS | Status: DC

## 2011-12-19 MED ORDER — BISACODYL 10 MG RE SUPP: 10.0000 mg | Freq: Every day | RECTAL | Status: DC | PRN

## 2011-12-19 MED ORDER — EPHEDRINE 5 MG/ML INJ
10.0000 mg | INTRAVENOUS | Status: DC | PRN
  Filled 2011-12-19: qty 4

## 2011-12-19 MED ORDER — IBUPROFEN 600 MG PO TABS
600.0000 mg | ORAL_TABLET | Freq: Four times a day (QID) | ORAL | Status: DC
  Administered 2011-12-19 – 2011-12-21 (×7): 600 mg via ORAL
  Filled 2011-12-19 (×7): qty 1

## 2011-12-19 MED ORDER — METOCLOPRAMIDE HCL 10 MG PO TABS: 10.0000 mg | ORAL_TABLET | Freq: Once | ORAL | Status: DC

## 2011-12-19 MED ORDER — PHENYLEPHRINE 40 MCG/ML (10ML) SYRINGE FOR IV PUSH (FOR BLOOD PRESSURE SUPPORT)
80.0000 ug | PREFILLED_SYRINGE | INTRAVENOUS | Status: DC | PRN
  Filled 2011-12-19: qty 5

## 2011-12-19 MED ORDER — PHENYLEPHRINE 40 MCG/ML (10ML) SYRINGE FOR IV PUSH (FOR BLOOD PRESSURE SUPPORT): 80.0000 ug | PREFILLED_SYRINGE | INTRAVENOUS | Status: DC | PRN

## 2011-12-19 MED ORDER — SIMETHICONE 80 MG PO CHEW: 80.0000 mg | CHEWABLE_TABLET | ORAL | Status: DC | PRN

## 2011-12-19 MED ORDER — DIPHENHYDRAMINE HCL 50 MG/ML IJ SOLN: 12.5000 mg | INTRAMUSCULAR | Status: DC | PRN

## 2011-12-19 MED ORDER — FLEET ENEMA 7-19 GM/118ML RE ENEM: 1.0000 | ENEMA | Freq: Every day | RECTAL | Status: DC | PRN

## 2011-12-19 MED ORDER — EPHEDRINE 5 MG/ML INJ: 10.0000 mg | INTRAVENOUS | Status: DC | PRN

## 2011-12-19 NOTE — MAU Note (Signed)
Patient states she is having contractions every 2-3 minutes with a little bloody show, no leaking fluid. Reports having felt fetal movement earlier.

## 2011-12-19 NOTE — Anesthesia Preprocedure Evaluation (Signed)
Anesthesia Evaluation  Patient identified by MRN, date of birth, ID band Patient awake    Reviewed: Allergy & Precautions, H&P , NPO status , Patient's Chart, lab work & pertinent test results, reviewed documented beta blocker date and time   History of Anesthesia Complications Negative for: history of anesthetic complications  Airway Mallampati: II TM Distance: >3 FB Neck ROM: full    Dental  (+) Teeth Intact   Pulmonary former smoker,  breath sounds clear to auscultation        Cardiovascular hypertension, Rhythm:regular Rate:Normal     Neuro/Psych  Headaches (migraines), PSYCHIATRIC DISORDERS (depression) CVA (with memory loss), Residual Symptoms    GI/Hepatic Neg liver ROS, GERD-  ,  Endo/Other  negative endocrine ROS  Renal/GU negative Renal ROS     Musculoskeletal   Abdominal   Peds  Hematology negative hematology ROS (+)   Anesthesia Other Findings   Reproductive/Obstetrics (+) Pregnancy                           Anesthesia Physical Anesthesia Plan  ASA: III  Anesthesia Plan: Epidural   Post-op Pain Management:    Induction:   Airway Management Planned:   Additional Equipment:   Intra-op Plan:   Post-operative Plan:   Informed Consent: I have reviewed the patients History and Physical, chart, labs and discussed the procedure including the risks, benefits and alternatives for the proposed anesthesia with the patient or authorized representative who has indicated his/her understanding and acceptance.     Plan Discussed with:   Anesthesia Plan Comments:         Anesthesia Quick Evaluation

## 2011-12-19 NOTE — Anesthesia Procedure Notes (Signed)
Epidural Patient location during procedure: OB Start time: 12/19/2011 5:45 PM  Staffing Performed by: anesthesiologist   Preanesthetic Checklist Completed: patient identified, site marked, surgical consent, pre-op evaluation, timeout performed, IV checked, risks and benefits discussed and monitors and equipment checked  Epidural Patient position: sitting Prep: site prepped and draped and DuraPrep Patient monitoring: continuous pulse ox and blood pressure Approach: midline Injection technique: LOR air  Needle:  Needle type: Tuohy  Needle gauge: 17 G Needle length: 9 cm and 9 Needle insertion depth: 5 cm cm Catheter type: closed end flexible Catheter size: 19 Gauge Catheter at skin depth: 10 cm Test dose: negative  Assessment Events: blood not aspirated, injection not painful, no injection resistance, negative IV test and no paresthesia  Additional Notes Discussed risk of headache, infection, bleeding, nerve injury and failed or incomplete block.  Patient voices understanding and wishes to proceed. Reason for block:procedure for pain

## 2011-12-19 NOTE — Telephone Encounter (Signed)
Pt called back regarding contractions 10-29mins apart. Pt denies any bleeding Pt denies any lost of fluid Pt has had fetal movement. Pt made aware it is not active labor until contraction are 4-5 mins apart for 1 1/2 -2 hrs. Pt told to monitor contraction for a few hours and call the office around 3pm to make [place] aware of her progress. Pt was having contractions and she was talking on the phone. Pt agreeable and will call back around 3 pm.   LC CMA

## 2011-12-20 ENCOUNTER — Encounter (HOSPITAL_COMMUNITY)
Admission: AD | Disposition: A | Discharge status: Home / Self Care | Payer: Self-pay | Source: Ambulatory Visit | Attending: Obstetrics and Gynecology

## 2011-12-20 DIAGNOSIS — O99119 Other diseases of the blood and blood-forming organs and certain disorders involving the immune mechanism complicating pregnancy, unspecified trimester: Secondary | ICD-10-CM | POA: Diagnosis present

## 2011-12-20 LAB — SURGICAL PCR SCREEN
MRSA, PCR: INVALID — AB
Staphylococcus aureus: INVALID — AB
Staphylococcus aureus: NEGATIVE

## 2011-12-20 LAB — CBC
HCT: 30.7 % — ABNORMAL LOW (ref 36.0–46.0)
Hemoglobin: 10.3 g/dL — ABNORMAL LOW (ref 12.0–15.0)
MCHC: 33.6 g/dL (ref 30.0–36.0)
MCV: 89.5 fL (ref 78.0–100.0)
RDW: 17.2 % — ABNORMAL HIGH (ref 11.5–15.5)

## 2011-12-20 SURGERY — LIGATION, FALLOPIAN TUBE, POSTPARTUM
Anesthesia: Epidural | Laterality: Bilateral

## 2011-12-20 SURGERY — LIGATION, FALLOPIAN TUBE, POSTPARTUM
Anesthesia: Epidural | Site: Abdomen | Laterality: Bilateral

## 2011-12-20 NOTE — Progress Notes (Signed)
Notified Almond Lint CNM that patient just stated she doesn't think she's going to go through with having her tubal today and that she is going to want to talk to the CNM and MD about her plans as far as that goes. Shelly states to keep patient NPO for now and to not give pre-op meds as of now and that she and Dr. Pennie Rushing will talk to her about this later. Will continue to monitor patient.

## 2011-12-20 NOTE — Progress Notes (Signed)
Post Partum Day 1 Subjective: no complaints, up ad lib, voiding, tolerating PO, + flatus and No excessive bleeding. The patient changed her mind concerning tubal sterilization. She is not certain that she wants no further children. She and I discussed at length options for contraception. She has used birth control pills unsuccessfully because of her inability to take them daily. She discontinue NuvaRing because she felt it was associated with increased vaginal infections.  Objective: Blood pressure 111/75, pulse 71, temperature 98.6 F (37 C), temperature source Oral, resp. rate 19, height 5\' 4"  (1.626 m), weight 185 lb (83.915 kg), last menstrual period 03/15/2011, SpO2 100.00%, unknown if currently breastfeeding.  Physical Exam:  General: alert, cooperative and no distress Lochia: appropriate Uterine Fundus: firm Incision: n/a DVT Evaluation: No evidence of DVT seen on physical exam.  Results for orders placed during the hospital encounter of 12/19/11 (from the past 24 hour(s))  CBC     Status: Abnormal   Collection Time   12/19/11  5:30 PM      Component Value Range   WBC 8.9  4.0 - 10.5 K/uL   RBC 4.33  3.87 - 5.11 MIL/uL   Hemoglobin 13.4  12.0 - 15.0 g/dL   HCT 40.9  81.1 - 91.4 %   MCV 91.0  78.0 - 100.0 fL   MCH 30.9  26.0 - 34.0 pg   MCHC 34.0  30.0 - 36.0 g/dL   RDW 78.2 (*) 95.6 - 21.3 %   Platelets 121 (*) 150 - 400 K/uL  SURGICAL PCR SCREEN     Status: Abnormal   Collection Time   12/19/11 11:20 PM      Component Value Range   MRSA, PCR INVALID RESULTS, SPECIMEN SENT FOR CULTURE (*) NEGATIVE   Staphylococcus aureus INVALID RESULTS, SPECIMEN SENT FOR CULTURE (*) NEGATIVE  SURGICAL PCR SCREEN     Status: Normal   Collection Time   12/20/11  2:55 AM      Component Value Range   MRSA, PCR NEGATIVE  NEGATIVE   Staphylococcus aureus NEGATIVE  NEGATIVE  CBC     Status: Abnormal   Collection Time   12/20/11  5:35 AM      Component Value Range   WBC 12.9 (*) 4.0 - 10.5  K/uL   RBC 3.43 (*) 3.87 - 5.11 MIL/uL   Hemoglobin 10.3 (*) 12.0 - 15.0 g/dL   HCT 08.6 (*) 57.8 - 46.9 %   MCV 89.5  78.0 - 100.0 fL   MCH 30.0  26.0 - 34.0 pg   MCHC 33.6  30.0 - 36.0 g/dL   RDW 62.9 (*) 52.8 - 41.3 %   Platelets 115 (*) 150 - 400 K/uL  RPR     Status: Normal   Collection Time   12/20/11  5:35 AM      Component Value Range   RPR NON REACTIVE  NON REACTIVE     Assessment/Plan: Asymptomatic thrombocytopenia Plan for discharge tomorrow,  Breastfeeding,  Circumcision in the office Contraception Undecided though a long discussion was held concerning options   LOS: 1 day   Harolyn Cocker P 12/20/2011, 3:38 PM

## 2011-12-20 NOTE — Clinical Social Work Maternal (Signed)
    Clinical Social Work Department PSYCHOSOCIAL ASSESSMENT - MATERNAL/CHILD 12/20/2011  Patient:  [name], [name]  Account Number:  [id number]  Admit Date:  12/19/2011  Marjo Bicker Name:   [name]    Clinical Social Worker:  [name]   Date/Time:  12/20/2011 11:40 AM  Date Referred:  12/20/2011   Referral source  CN     Referred reason  Depression/Anxiety  Substance Abuse   Other referral source:    I:  FAMILY / HOME ENVIRONMENT Child's legal guardian:  PARENT  Guardian - Name Guardian - Age Guardian - Address  [name] [address] [place], [place] [address]  [name] 36    Other household support members/support persons Name Relationship DOB   SON 2004   DAUGHTER 2007   DAUGHTER 2011   Other support:   Pt's mother, [name]    II  PSYCHOSOCIAL DATA Information Source:  Patient Interview  Financial and Community Resources Employment:   Chiropractor resources:  Medicaid If Medicaid - County:  [place] Other  [facility]   School / Grade:   Maternity Care Coordinator / Child Services Coordination / Early Interventions:  Cultural issues impacting care:    III  STRENGTHS Strengths  Adequate Resources  Home prepared for Child (including basic supplies)  Supportive family/friends   Strength comment:    IV  RISK FACTORS AND CURRENT PROBLEMS Current Problem:  YES   Risk Factor & Current Problem Patient Issue Family Issue Risk Factor / Current Problem Comment  Mental Illness Y N Hx of depression  Substance Abuse N N Hx of MJ use    V  SOCIAL WORK ASSESSMENT Sw met with pt to assess history of depression and MJ use. Pt acknowledges depression symptoms in past and at present. She identified financial hardships, as the primary source for current depression.  Pt is a single parent of 4 children.  She sought counseling at [facility] and thought sessions were helpful.  She had to stop counseling about 4 months ago, due to  scheduling conflicts with work schedule. Pt plans to schedule an appointment with the counselor at [facility] upon discharge.  She is not interested in antidepressant medication at this time.  She denies any history of SI.  Pt denies any MJ use in 9 years.  Sw explained hospital drug testing policy since screens ordered.  Pt expressed confidence that results would be negative.  UDS collection and meconium results are pending. She has majority of infant supplies expect for bottles, at this time.  She identified her mother, as her primary support person.  Sw will continue to follow up with drug screen results and make a referral if needed.  Sw encouraged pt to follow up with her counselor, as she is at higher risk of experiencing PP depression.      VI SOCIAL WORK PLAN Social Work Plan  No Further Intervention Required / No Barriers to Discharge   Type of pt/family education:   If child protective services report - county:   If child protective services report - date:   Information/referral to community resources comment:   Other social work plan:

## 2011-12-20 NOTE — Anesthesia Postprocedure Evaluation (Signed)
  Anesthesia Post-op Note  Patient: [name]  Procedure(s) Performed: * No procedures listed *  Patient Location: Mother/Baby  Anesthesia Type:Epidural  Level of Consciousness: awake, alert  and oriented  Airway and Oxygen Therapy: Patient Spontanous Breathing  Post-op Pain: mild  Post-op Assessment: Patient's Cardiovascular Status Stable, Respiratory Function Stable, Patent Airway, No signs of Nausea or vomiting and Pain level controlled  Post-op Vital Signs: stable  Complications: No apparent anesthesia complications

## 2011-12-20 NOTE — H&P (Signed)
[name] is a 36 y.o. female presenting for active labor, at [redacted]w[redacted]d, denies LOF, or VB +FM. Pt denies any PIH sx's.  RN checked pt in MAU and was 8cm, immediately tx to L&D. Prepped for epidural.  Spec exam prior to epidural, no HSV lesions noted, cx 9cm, pt denies urge to push.  FHR overall reassuring though decreased variability noted.     HPI: pt began [facility] at [facility] at 15wks. EDC based on 10wk [place].  She had a normal anatomy scan at 20wks. She self dc'd procardia for CHTN at [facility]. BP's remained WNL. Pt was seen by neurology secondary to migraines at 23wks, mag oxide was recommended. Pt had a normal 1hr gtt, Hgb was 10.9 and Fe supplement was recommended. Pt had baseline PIH labs and 24 hour for protein, which were all WNL, except mildly decreased platelets at 131. Pt remained without PIH sx's.  Growth [place] have been WNL. Pt had HSV outbreak at 34wks and took valtrex, denies any outbreaks since, and has been on valtrex prophylaxis. GBS was neg. Pt had borderline fetal testing and was admitted for prolonged monitoring and repeat BPP at 37wks, f/u testing has been WNL.    Maternal Medical History:  Reason for admission: Reason for admission: contractions and nausea.  Contractions: Onset was 6-12 hours ago.   Frequency: regular.   Perceived severity is strong.    Fetal activity: Perceived fetal activity is normal.   Last perceived fetal movement was within the past hour.    Prenatal complications: Hypertension.   Chronic, dc'd meds in 2nd trimester     OB History    Grav Para Term Preterm Abortions TAB SAB Ect Mult Living   5 4 4  1     4      Past Medical History  Diagnosis Date  . GERD (gastroesophageal reflux disease)   . Complication of anesthesia     BAck pain after epidural 2007  . Heart palpitations     FEW X PER WEEK  . Depression 2000    MEDS IN PAST  . Abnormal Pap smear     COLPO;   LEEP 2003  . Infection     CHLAMYDIA?  Marland Kitchen Infection     HERPES  . Infection    TRICH  . Infection     YEAST; FREQUENT WITH NUIVAring  . Infection     UTI OCC  . High blood pressure 2012    ON MED  . Anemia HX  IN TEENS    ON FE  . Fibroid   . IBS (irritable bowel syndrome)   . Head injury AS TEEN  . 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  . Migraines     OCULAR MIGRAINES;  HAS HX LOSS FO VISION   Past Surgical History  Procedure Date  . Laparoscopy   . Tonsillectomy 2002  . Wisdom tooth extraction 2005  . Induced abortion 1999   Family History: family history includes Alcohol abuse in her maternal grandfather; Arthritis in her maternal grandfather and maternal grandmother; Asthma in her cousin; Cancer in her cousin and maternal aunt; Diabetes in her maternal grandfather; Heart disease in her father and paternal uncle; Hypertension in her maternal aunt, maternal grandfather, maternal grandmother, and maternal uncle; Kidney disease in her maternal grandmother; Mental illness in her cousin, father, paternal aunt, paternal grandfather, and paternal uncle; Migraines in her mother; Stroke in her maternal grandfather; and Thyroid disease in  her maternal grandmother. Social History:  reports that she quit smoking about 15 months ago. Her smoking use included Cigarettes. She has never used smokeless tobacco. She reports that she does not drink alcohol or use illicit drugs. Pt has remote hx MJ use.    Prenatal Transfer Tool  Maternal Diabetes: No Genetic Screening: Declined Maternal Ultrasounds/Referrals: Normal Fetal Ultrasounds or other Referrals:  None Maternal Substance Abuse:  Yes:  Type: Smoker, Marijuana Significant Maternal Medications:  None valtrex  Significant Maternal Lab Results:  Lab values include: Group B Strep negative Other Comments:  None  Review of Systems  Gastrointestinal: Positive for nausea and vomiting.  All other systems reviewed and are negative.    Dilation: 9 Effacement (%): 100 Station:  -1 Exam by:: Shelly Anup Brigham CNM  Blood pressure 101/62, pulse 74, temperature 97.7 F (36.5 C), temperature source Oral, resp. rate 18, height 5\' 4"  (1.626 m), weight 185 lb (83.915 kg), last menstrual period 03/15/2011, SpO2 100.00%, unknown if currently breastfeeding. Maternal Exam:  Uterine Assessment: Contraction strength is firm.  Contraction duration is 60 seconds. Contraction frequency is regular.   Abdomen: Patient reports no abdominal tenderness. Fundal height is aga.   Estimated fetal weight is 7-8.   Fetal presentation: vertex  Introitus: Normal vulva. Normal vagina.  Pelvis: adequate for delivery.   Cervix: Cervix evaluated by sterile speculum exam and digital exam.   Spec exam for hx HSV, no lesions visible  Fetal Exam Fetal Monitor Review: Mode: ultrasound.   Baseline rate: 130.  Variability: minimal (<5 bpm).   Pattern: accelerations present and no decelerations.    Fetal State Assessment: Category I - tracings are normal.     Physical Exam  Nursing note and vitals reviewed. Constitutional: She is oriented to person, place, and time. She appears well-developed and well-nourished. She appears distressed.       Coping well, breathing through ctx  HENT:       Grossly WNL  Eyes: Pupils are equal, round, and reactive to light.  Neck: Normal range of motion.  Cardiovascular: Normal rate, regular rhythm and normal heart sounds.   Respiratory: Effort normal and breath sounds normal.  GI: Soft. Bowel sounds are normal.  Genitourinary: Vagina normal.  Musculoskeletal: Normal range of motion. She exhibits no edema and no tenderness.  Neurological: She is alert and oriented to person, place, and time. She has normal reflexes.  Skin: Skin is warm and dry.  Psychiatric: She has a normal mood and affect. Her behavior is normal.    Prenatal labs: ABO, Rh: O/POS/-- (05/20 1022) Antibody: NEG (05/20 1022) Rubella: 85.6 (05/20 1022) RPR: NON REAC (08/12 1233)  HBsAg:  NEGATIVE (05/20 1022)  HIV: NON REACTIVE (05/20 1022)  GBS: NEGATIVE (10/16 1212)  GC/CT neg TSH 1.575 1hr gtt normal   Assessment/Plan: IUP at [redacted]w[redacted]d Advanced labor GBS neg FHR reassuring  Admit to b.s. Per c/w Dr Pennie Rushing Routine L&D orders Epidural ASAP  Pt considering BTL, papers on chart, will discuss further    Dariana Garbett M 12/20/2011, 8:23 AM

## 2011-12-21 MED ORDER — OXYCODONE-ACETAMINOPHEN 5-325 MG PO TABS: 1.0000 | ORAL_TABLET | ORAL | Status: DC | PRN

## 2011-12-21 MED ORDER — IBUPROFEN 600 MG PO TABS: 600.0000 mg | ORAL_TABLET | Freq: Four times a day (QID) | ORAL | Status: DC | PRN

## 2011-12-21 NOTE — Discharge Summary (Signed)
Physician Discharge Summary  Patient ID: [name] MRN: [id number] DOB/AGE: September 22, 1975 36 y.o.  Admit date: 12/19/2011 Discharge date: 12/21/2011  Admission Diagnoses: [redacted]w[redacted]d active labor  Discharge Diagnoses:  Active Problems:  Anemia  HSV-2 (herpes simplex virus 2) infection  Depression  HTN (hypertension)  NSVD (normal spontaneous vaginal delivery)  Perineal laceration, first degree  Thrombocytopenia complicating pregnancy  SVD (spontaneous vaginal delivery) hx HTN not on meds  Discharged Condition: stable  Hospital Course:  [redacted]w[redacted]d active labor, SVD, 1 degree perineal lac, normal involution, undecided about birth control  Consults: None  Significant Diagnostic Studies: labs:  Hemoglobin & Hematocrit     Component Value Date/Time   HGB 10.3* 12/20/2011 0535   HCT 30.7* 12/20/2011 0535      Treatments:   Discharge Exam: Blood pressure 109/72, pulse 67, temperature 98.2 F (36.8 C), temperature source Oral, resp. rate 18, height 5\' 4"  (1.626 m), weight 83.915 kg (185 lb), last menstrual period 03/15/2011, SpO2 100.00%, unknown if currently breastfeeding. General appearance: alert, cooperative and no distress S: comfortable, little bleeding, slept little     breastfeeding O BP 109/72  Pulse 67  Temp 98.2 F (36.8 C) (Oral)  Resp 18  Ht 5\' 4"  (1.626 m)  Wt 83.915 kg (185 lb)  BMI 31.76 kg/m2  SpO2 100%  LMP 03/15/2011  Breastfeeding? Unknown     abd soft, nt, ff      sm  Flow perineum clean intact 1 degree perineal lac nonreactive     -Homans sign bilaterally,       None Edema    Component Value Date/Time   HGB 10.3* 12/20/2011 0535   HCT 30.7* 12/20/2011 0535    Disposition: 01-Home or Self Care  Discharge Orders    Future Appointments: Provider: Department: Dept Phone: Center:   12/23/2011 12:00 PM [facility] [phone] None   12/23/2011 1:15 PM Michael Litter, MD [facility]  [phone] None     Future Orders Please Complete By Expires   Discharge patient          Medication List     As of 12/21/2011  9:42 AM    STOP taking these medications         acetaminophen 500 MG tablet   Commonly known as: TYLENOL      TAKE these medications         ibuprofen 600 MG tablet   Commonly known as: ADVIL,MOTRIN   Take 1 tablet (600 mg total) by mouth every 6 (six) hours as needed for pain.      oxyCODONE-acetaminophen 5-325 MG per tablet   Commonly known as: PERCOCET/ROXICET   Take 1-2 tablets by mouth every 4 (four) hours as needed (moderate - severe pain).      ASK your doctor about these medications         prenatal multivitamin Tabs   Take 1 tablet by mouth daily.      valACYclovir 500 MG tablet   Commonly known as: VALTREX   Take 500 mg by mouth daily.           Follow-up Information    Follow up with [facility]. In 5 weeks.   Contact information:   [address] [place] [address] [phone]         Signed: Lavera Guise 12/21/2011, 9:42 AM

## 2011-12-22 LAB — MRSA CULTURE

## 2011-12-22 NOTE — Progress Notes (Signed)
Post discharge chart review completed.  

## 2011-12-23 ENCOUNTER — Encounter: Payer: Medicaid Other | Admitting: Obstetrics and Gynecology

## 2011-12-23 ENCOUNTER — Other Ambulatory Visit: Payer: Medicaid Other

## 2012-02-16 ENCOUNTER — Encounter: Payer: Self-pay | Admitting: Obstetrics and Gynecology

## 2012-02-19 ENCOUNTER — Encounter: Payer: Medicaid Other | Admitting: Obstetrics and Gynecology

## 2012-03-09 ENCOUNTER — Ambulatory Visit: Payer: Medicaid Other | Admitting: Obstetrics and Gynecology

## 2012-03-09 ENCOUNTER — Encounter: Payer: Self-pay | Admitting: Obstetrics and Gynecology

## 2012-03-09 DIAGNOSIS — N898 Other specified noninflammatory disorders of vagina: Secondary | ICD-10-CM

## 2012-03-09 DIAGNOSIS — Z124 Encounter for screening for malignant neoplasm of cervix: Secondary | ICD-10-CM

## 2012-03-09 DIAGNOSIS — O99345 Other mental disorders complicating the puerperium: Secondary | ICD-10-CM

## 2012-03-09 MED ORDER — SERTRALINE HCL 25 MG PO TABS: 25.0000 mg | ORAL_TABLET | Freq: Every day | ORAL | Status: DC

## 2012-03-09 MED ORDER — ACYCLOVIR 400 MG PO TABS: ORAL_TABLET | ORAL | Status: AC

## 2012-03-09 MED ORDER — SERTRALINE HCL 25 MG PO TABS: 50.0000 mg | ORAL_TABLET | Freq: Every day | ORAL | Status: DC

## 2012-03-09 NOTE — Progress Notes (Signed)
[name]  is10 weeks  postpartum following a vaginal delivery  at 40 gestational weeks Date: 12/19/2011  Female baby named  Jahmiko  delivered by Rande Lawman CNM   Breastfeeding: no Bottlefeeding:  yes  Post-partum blues / depression:  yes  EPDS score: 20 History of abnormal Pap:  yes  Last Pap: Date  02/21/2011 Gestational diabetes:  no  Contraception:  Desires Tubal Ligation   Normal urinary function:  yes Normal GI function:  yes Returning to work:  no  Subjective:     [name] is a 37 y.o. female who presents for a postpartum visit.  I have fully reviewed the prenatal and intrapartum course.  Pt c/o feeling overwhelmed. Scale 9/10.   Depression symptoms: sadness Homicidal thoughts: no Suicidal thoughts: no  Suggest starting antidepressants. Reviewed mechanism of action, expected benefits, time to benefits, possible side effects and possible need for trial and error. Also reviewed treatment duration of 6-12 months before considering weaning off medication. Referral to psychiatrist offered but declined at this time. Patient instructed to call office if symptoms worsen or suicidal / homicidal thoughts occur.Patient agreeable.  Patient is not sexually active.   The following portions of the patient's history were reviewed and updated as appropriate: allergies, current medications, past family history, past medical history, past social history, past surgical history and problem list.  Review of Systems Pertinent items are noted in HPI. Objective:    Breastfeeding? Unknown  General:  alert, cooperative and no distress     Lungs: clear to auscultation bilaterally  Heart:  regular rate and rhythm, S1, S2 normal, no murmur  Abdomen: soft, non-tender; bowel sounds normal; no masses,  no organomegaly   Vulva:  normal  Vagina: normal vagina  Cervix:  normal  Corpus: normal size, contour, position, consistency, mobility, non-tender RV  Adnexa:  normal adnexa       Assessment:   Normal postpartum exam.  PP depression Vaginal discharge Pap smear done at today's visit. Plan:   Pap Smear Today PP Depression Discussed Zoloft 50 mg Given  F/U 3 weeks  Osom BV done:negative  Tubal ligation reviewed with the patient Procedure reviewed with need for general anesthesia. Informed of the failure rate of 1/500 and irreversibility. Risks of operative complications reviewed with possible anesthetic complication, infection, bleeding and injury to intra-abdominal organ. Advantage of local anesthesia for vasectomy also mentioned.  Mirena was reviewed with the patient With expected benefits of: lack of estrogen,5 year duration, high reliability at 99.9%, reversibility, reduction or cessation of menstrual flow and improvement or resolution of dysmenorrhea/pelvic pain. Risks at the time of insertion were reviewed: dysfunctional uterine bleeding lasting up to 6 months, infection and uterine perforation which may require laparoscopy for retrieval. Instructions for day of insertion discussed:  yes avoidance of unprotected intercourse 2 weeks prior, best to schedule during a menstrual cycle and use of Ibuprofen 600 mg 1 hour before appointment.  GC/Chlamydia was collected at this visit: yes  Nexplanon was reviewed with the patient With expected benefits of: 3 year duration, high reliability at 99.9%, lack of estrogen and ease of insertion. Risks of DUB and possible difficult removal discussed     Silverio Lay MD 03/09/2012 2:22 PM

## 2012-03-09 NOTE — Progress Notes (Signed)
[name]  is 10 weeks postpartum following a spontaneous vaginal delivery at 19 gestational weeks Date: 12/19/11 female baby named  delivered by Vance Gather.  Breastfeeding: no Bottlefeeding:  yes  Post-partum blues / depression:  no  EPDS score: 20  History of abnormal Pap:  no  Last Pap: Date  02/21/11 Gestational diabetes:  no  Contraception:  Desires bilateral tubal ligation  Normal urinary function:  yes Normal GI function:  yes Returning to work:  yes  Subjective:     [name] is a 37 y.o. female who presents for a postpartum visit.  I have fully reviewed the prenatal and intrapartum course.    Patient is not sexually active.   The following portions of the patient's history were reviewed and updated as appropriate: allergies, current medications, past family history, past medical history, past social history, past surgical history and problem list.  Review of Systems Pertinent items are noted in HPI.   Objective:    Breastfeeding? Unknown  General:  alert, cooperative and no distress     Lungs: clear to auscultation bilaterally  Heart:  regular rate and rhythm, S1, S2 normal, no murmur  Abdomen: soft, non-tender; bowel sounds normal; no masses,  no organomegaly   Vulva:  normal  Vagina: normal vagina  Cervix:  normal  Corpus: normal size, contour, position, consistency, mobility, non-tender  Adnexa:  normal adnexa        Assessment:   Normal postpartum exam.  Pap smear done at today's visit. Plan:    Silverio Lay MD 03/09/2012 2:11 PM

## 2012-03-11 LAB — PAP IG, CT-NG, RFX HPV ASCU
Chlamydia Probe Amp: NEGATIVE
GC Probe Amp: NEGATIVE

## 2012-03-12 LAB — HUMAN PAPILLOMAVIRUS, HIGH RISK: HPV DNA High Risk: NOT DETECTED

## 2012-03-15 NOTE — Progress Notes (Signed)
Quick Note:  Please send ASCUS letter. Pap in 1 year. ______ 

## 2012-03-16 ENCOUNTER — Encounter: Payer: Self-pay | Admitting: Obstetrics and Gynecology

## 2012-04-01 ENCOUNTER — Encounter: Payer: Medicaid Other | Admitting: Obstetrics and Gynecology

## 2012-08-04 ENCOUNTER — Other Ambulatory Visit: Payer: Self-pay | Admitting: Obstetrics and Gynecology

## 2012-08-04 DIAGNOSIS — N644 Mastodynia: Secondary | ICD-10-CM

## 2012-08-04 DIAGNOSIS — D241 Benign neoplasm of right breast: Secondary | ICD-10-CM

## 2012-08-17 ENCOUNTER — Ambulatory Visit
Admission: RE | Admit: 2012-08-17 | Discharge: 2012-08-17 | Disposition: A | Discharge status: Home / Self Care | Payer: No Typology Code available for payment source | Source: Ambulatory Visit | Attending: Obstetrics and Gynecology | Admitting: Obstetrics and Gynecology

## 2012-08-17 DIAGNOSIS — D241 Benign neoplasm of right breast: Secondary | ICD-10-CM

## 2012-08-17 DIAGNOSIS — N644 Mastodynia: Secondary | ICD-10-CM

## 2013-02-18 ENCOUNTER — Other Ambulatory Visit: Payer: Self-pay | Admitting: Obstetrics and Gynecology

## 2013-02-18 DIAGNOSIS — N6310 Unspecified lump in the right breast, unspecified quadrant: Secondary | ICD-10-CM

## 2013-02-28 ENCOUNTER — Other Ambulatory Visit: Payer: Self-pay

## 2013-02-28 ENCOUNTER — Other Ambulatory Visit: Payer: Self-pay | Admitting: Obstetrics and Gynecology

## 2013-02-28 DIAGNOSIS — N6310 Unspecified lump in the right breast, unspecified quadrant: Secondary | ICD-10-CM

## 2013-03-01 ENCOUNTER — Other Ambulatory Visit: Payer: Self-pay

## 2013-03-04 ENCOUNTER — Ambulatory Visit
Admission: RE | Admit: 2013-03-04 | Discharge: 2013-03-04 | Disposition: A | Discharge status: Home / Self Care | Payer: Managed Care, Other (non HMO) | Source: Ambulatory Visit | Attending: Obstetrics and Gynecology | Admitting: Obstetrics and Gynecology

## 2013-03-04 DIAGNOSIS — N6310 Unspecified lump in the right breast, unspecified quadrant: Secondary | ICD-10-CM

## 2013-04-06 ENCOUNTER — Encounter (INDEPENDENT_AMBULATORY_CARE_PROVIDER_SITE_OTHER): Payer: Self-pay

## 2013-04-06 ENCOUNTER — Encounter (INDEPENDENT_AMBULATORY_CARE_PROVIDER_SITE_OTHER): Payer: Self-pay | Admitting: General Surgery

## 2013-04-06 ENCOUNTER — Ambulatory Visit (INDEPENDENT_AMBULATORY_CARE_PROVIDER_SITE_OTHER): Payer: Managed Care, Other (non HMO) | Admitting: General Surgery

## 2013-04-06 VITALS — BP 136/94 | HR 60 | Temp 99.1°F | Resp 16 | Ht 64.0 in | Wt 153.0 lb

## 2013-04-06 DIAGNOSIS — N63 Unspecified lump in unspecified breast: Secondary | ICD-10-CM

## 2013-04-06 NOTE — Progress Notes (Signed)
Patient ID: [name], female   DOB: 1975-10-12, 38 y.o.   MRN: [id number]  Chief Complaint  Patient presents with  . Breast Mass    new pt- eval right breast    HPI [name] is a 38 y.o. female.   HPI  She is referred by Dr. Cletis Media for further evaluation of a right breast mass.  She first noticed this 3 years ago and she points to the 3:00 position of the right breast.  It is tender when touched.   She thinks maybe it is a little larger but she is unsure. She has had mammograms and ultrasounds which did not show any suspicious disease.  She had a maternal aunt and cousin who had breast cancer. Her first menarche was at age 46. First live birth was at age 89. Still having menstrual periods. No exogenous hormone use.  Past Medical History  Diagnosis Date  . GERD (gastroesophageal reflux disease)   . Complication of anesthesia     BAck pain after epidural 2007  . Heart palpitations     FEW X PER WEEK  . Depression 2000    MEDS IN PAST  . Abnormal Pap smear     COLPO;   LEEP 2003  . Infection     CHLAMYDIA?  Marland Kitchen Infection     HERPES  . Infection     TRICH  . Infection     YEAST; FREQUENT WITH NUIVAring  . Infection     UTI OCC  . High blood pressure 2012    ON MED  . Anemia HX  IN TEENS    ON FE  . Fibroid   . IBS (irritable bowel syndrome)   . Head injury AS TEEN  . 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  . Migraines     OCULAR MIGRAINES;  HAS HX LOSS FO VISION    Past Surgical History  Procedure Laterality Date  . Laparoscopy    . Tonsillectomy  2002  . Wisdom tooth extraction  2005  . Induced abortion  1999    Family History  Problem Relation Age of Onset  . Migraines Mother   . Heart disease Father     MI  . Mental illness Father   . Hypertension Maternal Aunt   . Cancer Maternal Aunt     breast  . Hypertension Maternal Uncle   . Mental illness Paternal Aunt   . Mental illness Paternal Uncle   . Heart  disease Paternal Uncle   . Thyroid disease Maternal Grandmother   . Kidney disease Maternal Grandmother     DIALYSIS  . Hypertension Maternal Grandmother   . Arthritis Maternal Grandmother   . Cancer Maternal Grandmother   . Hypertension Maternal Grandfather   . Diabetes Maternal Grandfather   . Arthritis Maternal Grandfather   . Alcohol abuse Maternal Grandfather   . Stroke Maternal Grandfather   . Mental illness Paternal Grandfather   . Cancer Cousin     breast  . Asthma Cousin   . Mental illness Cousin     Social History History  Substance Use Topics  . Smoking status: Former Smoker    Types: Cigarettes    Quit date: 08/30/2010  . Smokeless tobacco: Never Used  . Alcohol Use: Yes     Comment: OCC    Allergies  Allergen Reactions  . Sucralose Nausea Only    Current Outpatient Prescriptions  Medication Sig Dispense Refill  .  acyclovir (ZOVIRAX) 400 MG tablet Three times daily x 7 days if outbreak  28 tablet  11  . ibuprofen (ADVIL,MOTRIN) 600 MG tablet Take 1 tablet (600 mg total) by mouth every 6 (six) hours as needed for pain.  30 tablet  1  . Iron-Vit C-Vit B12-Folic Acid (IRON 833 PLUS PO) Take by mouth.      . Prenatal Vit-Fe Fumarate-FA (PRENATAL MULTIVITAMIN) TABS Take 1 tablet by mouth daily.       No current facility-administered medications for this visit.    Review of Systems Review of Systems  Constitutional: Negative.   HENT: Negative.   Eyes: Positive for visual disturbance.  Respiratory: Negative.   Cardiovascular: Positive for leg swelling.  Gastrointestinal: Positive for constipation.  Genitourinary: Negative.   Hematological: Bruises/bleeds easily.    Blood pressure 136/94, pulse 60, temperature 99.1 F (37.3 C), temperature source Oral, resp. rate 16, height 5\' 4"  (1.626 m), weight 153 lb (69.4 kg), not currently breastfeeding.  Physical Exam Physical Exam  Constitutional: She appears well-developed and well-nourished. No distress.   HENT:  Head: Normocephalic and atraumatic.  Neck: Neck supple.  Pulmonary/Chest:  Right breast-there is a tender irregular area deep at the 3:00 position of the right breast but no dominant masses. There are similar irregular areas throughout the breast.  No suspicious skin changes.  Left breast-irregularities noted but no dominant mass.  No suspicious skin changes.  Musculoskeletal:  No palpable supraclavicular or axillary adenopathy.  Lymphadenopathy:    She has no cervical adenopathy.  Skin: Skin is warm and dry.  Psychiatric: She has a normal mood and affect. Her behavior is normal.    Data Reviewed Mammogram and ultrasound reports  Assessment    Chronic right breast irregularity at 3:00 position. No clinical or radiographic evidence of malignancy. I feel this is benign.    Plan    Repeat examination in 3 months.        Pete Merten J 04/06/2013, 9:50 AM

## 2013-04-06 NOTE — Patient Instructions (Signed)
Will see you in 3 months for repeat examination. If the area starts growing rapidly, please call.

## 2013-06-09 ENCOUNTER — Encounter (INDEPENDENT_AMBULATORY_CARE_PROVIDER_SITE_OTHER): Payer: Self-pay | Admitting: General Surgery

## 2013-10-04 ENCOUNTER — Encounter (HOSPITAL_COMMUNITY): Payer: Self-pay | Admitting: Emergency Medicine

## 2013-10-04 ENCOUNTER — Emergency Department (HOSPITAL_COMMUNITY): Payer: Managed Care, Other (non HMO)

## 2013-10-04 ENCOUNTER — Emergency Department (HOSPITAL_COMMUNITY)
Admission: EM | Admit: 2013-10-04 | Discharge: 2013-10-04 | Discharge status: Against Medical Advice | Payer: Managed Care, Other (non HMO) | Attending: Emergency Medicine | Admitting: Emergency Medicine

## 2013-10-04 DIAGNOSIS — M7989 Other specified soft tissue disorders: Secondary | ICD-10-CM | POA: Insufficient documentation

## 2013-10-04 DIAGNOSIS — Z3202 Encounter for pregnancy test, result negative: Secondary | ICD-10-CM | POA: Insufficient documentation

## 2013-10-04 DIAGNOSIS — Z8673 Personal history of transient ischemic attack (TIA), and cerebral infarction without residual deficits: Secondary | ICD-10-CM | POA: Diagnosis not present

## 2013-10-04 DIAGNOSIS — Z8719 Personal history of other diseases of the digestive system: Secondary | ICD-10-CM | POA: Diagnosis not present

## 2013-10-04 DIAGNOSIS — D649 Anemia, unspecified: Secondary | ICD-10-CM | POA: Insufficient documentation

## 2013-10-04 DIAGNOSIS — Z8659 Personal history of other mental and behavioral disorders: Secondary | ICD-10-CM | POA: Insufficient documentation

## 2013-10-04 DIAGNOSIS — Z87891 Personal history of nicotine dependence: Secondary | ICD-10-CM | POA: Diagnosis not present

## 2013-10-04 DIAGNOSIS — Z87828 Personal history of other (healed) physical injury and trauma: Secondary | ICD-10-CM | POA: Diagnosis not present

## 2013-10-04 DIAGNOSIS — Z8619 Personal history of other infectious and parasitic diseases: Secondary | ICD-10-CM | POA: Insufficient documentation

## 2013-10-04 DIAGNOSIS — Z8742 Personal history of other diseases of the female genital tract: Secondary | ICD-10-CM | POA: Insufficient documentation

## 2013-10-04 DIAGNOSIS — Z79899 Other long term (current) drug therapy: Secondary | ICD-10-CM | POA: Diagnosis not present

## 2013-10-04 DIAGNOSIS — I10 Essential (primary) hypertension: Secondary | ICD-10-CM | POA: Insufficient documentation

## 2013-10-04 LAB — BASIC METABOLIC PANEL
Anion gap: 15 (ref 5–15)
BUN: 11 mg/dL (ref 6–23)
CALCIUM: 9.1 mg/dL (ref 8.4–10.5)
CO2: 21 mEq/L (ref 19–32)
Chloride: 103 mEq/L (ref 96–112)
Creatinine, Ser: 0.8 mg/dL (ref 0.50–1.10)
GFR calc Af Amer: >90 mL/min (ref >90)
GLUCOSE: 77 mg/dL (ref 70–99)
Potassium: 3.7 mEq/L (ref 3.7–5.3)
SODIUM: 139 meq/L (ref 137–147)

## 2013-10-04 LAB — IRON AND TIBC
Iron: 16 ug/dL — ABNORMAL LOW (ref 42–135)
SATURATION RATIOS: 3 % — AB (ref 20–55)
TIBC: 555 ug/dL — ABNORMAL HIGH (ref 250–470)
UIBC: 539 ug/dL — ABNORMAL HIGH (ref 125–400)

## 2013-10-04 LAB — TYPE AND SCREEN
ABO/RH(D): O POS
Antibody Screen: NEGATIVE

## 2013-10-04 LAB — VITAMIN B12: VITAMIN B 12: 884 pg/mL (ref 211–911)

## 2013-10-04 LAB — URINALYSIS, ROUTINE W REFLEX MICROSCOPIC
BILIRUBIN URINE: NEGATIVE
GLUCOSE, UA: NEGATIVE mg/dL
Hgb urine dipstick: NEGATIVE
KETONES UR: NEGATIVE mg/dL
LEUKOCYTES UA: NEGATIVE
Nitrite: NEGATIVE
PROTEIN: NEGATIVE mg/dL
Specific Gravity, Urine: 1.013 (ref 1.005–1.030)
Urobilinogen, UA: 0.2 mg/dL (ref 0.0–1.0)
pH: 6.5 (ref 5.0–8.0)

## 2013-10-04 LAB — FERRITIN: Ferritin: 2 ng/mL — ABNORMAL LOW (ref 10–291)

## 2013-10-04 LAB — PRO B NATRIURETIC PEPTIDE: Pro B Natriuretic peptide (BNP): 104.9 pg/mL (ref 0–125)

## 2013-10-04 LAB — RETICULOCYTES
RBC.: 3.27 MIL/uL — AB (ref 3.87–5.11)
Retic Count, Absolute: 36 10*3/uL (ref 19.0–186.0)
Retic Ct Pct: 1.1 % (ref 0.4–3.1)

## 2013-10-04 LAB — CBC
HCT: 23.6 % — ABNORMAL LOW (ref 36.0–46.0)
Hemoglobin: 6.8 g/dL — CL (ref 12.0–15.0)
MCH: 20.1 pg — AB (ref 26.0–34.0)
MCHC: 28.8 g/dL — AB (ref 30.0–36.0)
MCV: 69.8 fL — ABNORMAL LOW (ref 78.0–100.0)
PLATELETS: 163 10*3/uL (ref 150–400)
RBC: 3.38 MIL/uL — ABNORMAL LOW (ref 3.87–5.11)
RDW: 18.1 % — ABNORMAL HIGH (ref 11.5–15.5)
WBC: 4.6 10*3/uL (ref 4.0–10.5)

## 2013-10-04 LAB — I-STAT TROPONIN, ED: Troponin i, poc: 0 ng/mL (ref 0.00–0.08)

## 2013-10-04 LAB — FOLATE: Folate: >20 ng/mL

## 2013-10-04 LAB — POC URINE PREG, ED: Preg Test, Ur: NEGATIVE

## 2013-10-04 LAB — MAGNESIUM: MAGNESIUM: 2.2 mg/dL (ref 1.5–2.5)

## 2013-10-04 LAB — TSH: TSH: 0.773 u[IU]/mL (ref 0.350–4.500)

## 2013-10-04 NOTE — ED Notes (Signed)
Pt sts she can't stay any longer because she has to go pick up her kids from day care, and she has no family in town. Pt agreed to have her blood work done and sts she will return to ed once her family arrives to town. PA Shari aware and pt will be discharged AMA

## 2013-10-04 NOTE — Discharge Instructions (Signed)
PLEASE RETURN LATER AS PLANNED FOR NEEDED INPATIENT CARE OF LOW HEMOGLOBIN AND SYMPTOMATIC ANEMIA.

## 2013-10-04 NOTE — ED Notes (Signed)
Shari PA notified of critical lab value Hgb- 6.8

## 2013-10-04 NOTE — ED Notes (Signed)
Pt alert, arrives from home, c/o swelling to lower ext, onset unknown, states swelling was worse yesterday, resp even unlabored, ambulates to triage

## 2013-10-04 NOTE — Progress Notes (Signed)
*  Preliminary Results* Bilateral lower extremity venous duplex completed. Bilateral lower extremities are negative for deep vein thrombosis. There is no evidence of Baker's cyst bilaterally.  Preliminary results discussed with Dr.Gentry.  10/04/2013  Maudry Mayhew, RVT, RDCS, RDMS

## 2013-10-04 NOTE — ED Provider Notes (Signed)
CSN: [id number]     Arrival date & time 10/04/13  1155 History   First MD Initiated Contact with Patient 10/04/13 1453     Chief Complaint  Patient presents with  . Leg Swelling     (Consider location/radiation/quality/duration/timing/severity/associated sxs/prior Treatment) HPI Comments: [name] is a 38 y.o. female with a PMHx of GERD, heart palpitations, depression, HTN, anemia, IBS, ocular migraines, and fibroids, who presents to the ED with ongoing BLE swelling which has been present for several months but became worse last night after wearing high heels all day. Pt states she's had LLE swelling for many years, and when she's pregnant she develops BLE swelling, for which she's had a negative U/S in the past for DVT. States the swelling comes on gradually over the course of the day, worsened with activity, and then improves with elevation. Reports that upon awakening, her legs are "normal". States that her R leg became more swollen than her L last night after wearing heels. Reports that the pain feels tight and achy, particularly with pain in the posterior aspect of the calf, nonradiating, mild in severity, 5/10, intermittent only occurring with the swelling. Endorses that she's felt fatigued over the last several months, and has felt "foggy headed" and somewhat lightheaded lately. Also endorses feeling cold recently. Denies fevers, chills, hair changes, URI symptoms, neck pain/stiffness, palpitations, CP, SOB, cough, hemoptysis, PND, orthopnea, recent travel, estrogen use, abd pain, N/V/D/C, dysuria, hematuria, vaginal symptoms, HA, vertigo, numbness, vision changes, weakness, or paresthesias. Denies skin color changes to her legs, denies warmth or red streaking. Has a hx of HTN but has not been taking her BP meds. +FHx of cardiovascular disease at young age, father died of MI at <45y/o. No known hx of DVT/PE in family or herself. Currently menstruating.  Patient is a 38 y.o. female  presenting with leg pain. The history is provided by the patient. No language interpreter was used.  Leg Pain Location:  Leg Time since incident: several months. Injury: no   Leg location:  L lower leg and R lower leg Pain details:    Quality:  Aching and pressure   Radiates to:  Does not radiate   Severity:  Mild   Onset quality:  Gradual   Duration: several months, worsened yesterday.   Timing:  Intermittent   Progression:  Unchanged Chronicity:  Recurrent Prior injury to area:  No Relieved by:  Elevation Worsened by:  Activity Ineffective treatments:  None tried Associated symptoms: fatigue (ongoing x months) and swelling (ongoing x months, worsening yesterday)   Associated symptoms: no back pain, no decreased ROM, no fever, no itching, no muscle weakness, no neck pain, no numbness, no stiffness and no tingling     Past Medical History  Diagnosis Date  . GERD (gastroesophageal reflux disease)   . Complication of anesthesia     BAck pain after epidural 2007  . Heart palpitations     FEW X PER WEEK  . Depression 2000    MEDS IN PAST  . Abnormal Pap smear     COLPO;   LEEP 2003  . Infection     CHLAMYDIA?  Marland Kitchen Infection     HERPES  . Infection     TRICH  . Infection     YEAST; FREQUENT WITH NUIVAring  . Infection     UTI OCC  . High blood pressure 2012    ON MED  . Anemia HX  IN TEENS    ON FE  .  Fibroid   . IBS (irritable bowel syndrome)   . Head injury AS TEEN  . 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  . Migraines     OCULAR MIGRAINES;  HAS HX LOSS FO VISION   Past Surgical History  Procedure Laterality Date  . Laparoscopy    . Tonsillectomy  2002  . Wisdom tooth extraction  2005  . Induced abortion  1999   Family History  Problem Relation Age of Onset  . Migraines Mother   . Heart disease Father     MI  . Mental illness Father   . Hypertension Maternal Aunt   . Cancer Maternal Aunt     breast  . Hypertension  Maternal Uncle   . Mental illness Paternal Aunt   . Mental illness Paternal Uncle   . Heart disease Paternal Uncle   . Thyroid disease Maternal Grandmother   . Kidney disease Maternal Grandmother     DIALYSIS  . Hypertension Maternal Grandmother   . Arthritis Maternal Grandmother   . Cancer Maternal Grandmother   . Hypertension Maternal Grandfather   . Diabetes Maternal Grandfather   . Arthritis Maternal Grandfather   . Alcohol abuse Maternal Grandfather   . Stroke Maternal Grandfather   . Mental illness Paternal Grandfather   . Cancer Cousin     breast  . Asthma Cousin   . Mental illness Cousin    History  Substance Use Topics  . Smoking status: Former Smoker    Types: Cigarettes    Quit date: 08/30/2010  . Smokeless tobacco: Never Used  . Alcohol Use: Yes     Comment: OCC   OB History   Grav Para Term Preterm Abortions TAB SAB Ect Mult Living   5 4 4  1     4      Review of Systems  Constitutional: Positive for fatigue (ongoing x months). Negative for fever, chills, diaphoresis, appetite change and unexpected weight change.  HENT: Negative for congestion and facial swelling.   Eyes: Negative for visual disturbance.  Respiratory: Negative for cough, chest tightness and shortness of breath.   Cardiovascular: Positive for leg swelling (bilateral, R>L today). Negative for chest pain and palpitations.  Gastrointestinal: Negative for nausea, vomiting, abdominal pain, diarrhea, constipation and abdominal distention.  Endocrine: Positive for cold intolerance. Negative for heat intolerance, polydipsia, polyphagia and polyuria.  Genitourinary: Negative for dysuria, urgency, frequency, hematuria, flank pain, vaginal bleeding, vaginal discharge, menstrual problem and pelvic pain.  Musculoskeletal: Negative for arthralgias, back pain, joint swelling, myalgias, neck pain, neck stiffness and stiffness.  Skin: Negative for color change and itching.  Neurological: Negative for  dizziness, syncope, weakness, light-headedness, numbness and headaches.  Psychiatric/Behavioral: Negative for confusion.  10 Systems reviewed and are negative for acute change except as noted in the HPI.     Allergies  Sucralose  Home Medications   Prior to Admission medications   Medication Sig Start Date End Date Taking? Authorizing Provider  acyclovir (ZOVIRAX) 400 MG tablet Three times daily x 7 days if outbreak 03/09/12  Yes Dede Query Rivard, MD  ibuprofen (ADVIL,MOTRIN) 200 MG tablet Take 200 mg by mouth once as needed for cramping.   Yes Historical Provider, MD  Multiple Vitamin (MULTIVITAMIN WITH MINERALS) TABS tablet Take 1 tablet by mouth daily.   Yes Historical Provider, MD   BP 140/88  Pulse 70  Temp(Src) 98 F (36.7 C) (Oral)  Resp 16  Wt 151 lb (68.493  kg)  SpO2 99%  LMP 10/04/2013 Physical Exam  Nursing note and vitals reviewed. Constitutional: She is oriented to person, place, and time. She appears well-developed and well-nourished. No distress.  Afebrile, nontoxic, NAD. VSS upon arrival, HTN noted ~140s-160s/80s-100s.   HENT:  Head: Normocephalic and atraumatic.  Nose: Nose normal.  Mouth/Throat: Uvula is midline, oropharynx is clear and moist and mucous membranes are normal.  Eyes: Conjunctivae and EOM are normal. Pupils are equal, round, and reactive to light. Right eye exhibits no discharge. Left eye exhibits no discharge.  EOMI, PERRL, visual fields at baseline  Neck: Normal range of motion. Neck supple. No JVD present.  No JVD  Cardiovascular: Normal rate, regular rhythm, normal heart sounds and intact distal pulses.  Exam reveals no gallop and no friction rub.   No murmur heard. Pulses:      Radial pulses are 2+ on the right side, and 2+ on the left side.       Femoral pulses are 2+ on the right side, and 2+ on the left side.      Dorsalis pedis pulses are 2+ on the right side, and 2+ on the left side.       Posterior tibial pulses are 2+ on the right  side, and 2+ on the left side.  RRR, nl s1/s2, no m/r/g, distal pulses intact, femoral pulses intact bilaterally  Pulmonary/Chest: Effort normal and breath sounds normal. No respiratory distress. She has no decreased breath sounds. She has no wheezes. She has no rhonchi. She has no rales.  CTAB in all lung fields  Abdominal: Soft. Normal appearance and bowel sounds are normal. She exhibits no distension and no fluid wave. There is no tenderness. There is no rigidity, no rebound, no guarding and no CVA tenderness.  Musculoskeletal: Normal range of motion.       Right hip: Normal.       Left hip: Normal.       Right knee: Normal.       Left knee: Normal.       Right ankle: Normal.       Left ankle: Normal.       Right lower leg: She exhibits edema (trace).       Left lower leg: She exhibits edema (trace).  Moving all extremities with ease. Spine nontender to palpation, no sacral edema. BLE with trace pretibial edema up to knees, no skin changes, no erythema, no warmth, neg Homan's sign bilaterally. FROM intact in ankles, knees, and hips bilaterally, no joint tenderness. No masses palpated in posterior knees. No superficial veins. DP and femoral pulses equal bilaterally. Strength 5/5 in all extremities, sensation grossly intact in all extremities.   Lymphadenopathy:       Right: No inguinal adenopathy present.       Left: No inguinal adenopathy present.  Neurological: She is alert and oriented to person, place, and time. She has normal strength. No cranial nerve deficit or sensory deficit.  Strength 5/5 in all extremities, sensation grossly intact in all extremities, CN2-12 grossly intact  Skin: Skin is warm, dry and intact. No rash noted.  Trace pretibial edema in BLE, R>L, without erythema, warmth, or skin changes noted.   Psychiatric: She has a normal mood and affect.    ED Course  Procedures (including critical care time) Labs Review Labs Reviewed - No data to display  Imaging  Review No results found.   EKG Interpretation None      MDM   Final diagnoses:  None    38y/o female with BLE swelling, R>L today. Gradually worsens over the day, likely dependent edema, but given extensive family cardiac hx and pt's current HTN, will obtain labs to r/o end organ damage. Some symptoms consistent with thyroid dysfunction, will obtain TSH to provide some additional clues. Will obtain DVT study on BLEs given R calf is larger than L, and would like to r/o DVT. Doubt PE at this time due to lack of symptoms, did not obtain CT chest. Pt with no neuro symptoms, and BP is relatively at baseline, will not obtain CT head due to low clinical suspicion for cerebral end organ damage. Pt not having severe pain at this time, does not request pain meds. Will sign out pt to oncoming PA, Charlann Lange, who will f/up with labs. Plan would be to d/c with compression stockings if labs are neg. If labs return positive, treat accordingly. Please see Conception Oms dictation for further documentation of care and dispo.  BP 156/86  Pulse 57  Temp(Src) 97.8 F (36.6 C) (Oral)  Resp 16  Wt 151 lb (68.493 kg)  SpO2 100%  LMP 10/04/2013   Patty Sermons Camprubi-Soms, PA-C 10/04/13 1636

## 2013-10-04 NOTE — ED Provider Notes (Signed)
LE edema, intermittent, over the last several months. Right greater than left acutely. duplux pending, labs pending. If normal then discharge home with compression stockings, NSAIDs.  Blood pressure is untreated, "I just control it myself".   Hgb found to be 6.8. Discussed symptomatic anemia with the patient and need for blood transfusion. Patient is adamant that she does not want blood products. Dr. Colin Rhein will see patient to discuss options which essentially include hospitalization for blood transfusion or discharge home AMA with outpatient follow up. She states anemia has been secondary to heavy periods lasting, usually, 7 days. She just started her menses yesterday, putting her at greater risk of falling hgb. Will consider progesterone.   Dr. Colin Rhein discussed condition with the patient, reviewed risk and benefit of treatment and likely decline in hgb level. Patient reports she will transfuse if hgb drops as anticipated. However, she needs to leave the hospital because she has no available care for her children until later this evening. She has no choice but to leave and will return later for admission and further treatment. Will discharge AMA.  Dewaine Oats, PA-C 10/04/13 1732

## 2013-10-04 NOTE — ED Provider Notes (Signed)
Assumed care of pt from Dr. Betsey Holiday.  She has bil le edema and anemia on my assumption of care. She has a ho anemia, and on assumption of care is anemic, starting her menstrual cycle.  Discussed admission and risks of leaving, however pt will not stay to be admitted.  I discussed possibility of permanent disability or death, offered other options, and the pt continued to refuse.  She voiced good understanding of these consequences.  I performed an examination on the patient including cardiac, pulmonary, and gi systems which were unremarkable    Debby Freiberg, MD 10/06/13 [phone]

## 2013-10-05 ENCOUNTER — Emergency Department (HOSPITAL_COMMUNITY)
Admission: EM | Admit: 2013-10-05 | Discharge: 2013-10-05 | Disposition: A | Discharge status: Home / Self Care | Payer: Managed Care, Other (non HMO) | Attending: Emergency Medicine | Admitting: Emergency Medicine

## 2013-10-05 ENCOUNTER — Encounter (HOSPITAL_COMMUNITY): Payer: Self-pay | Admitting: Emergency Medicine

## 2013-10-05 DIAGNOSIS — D509 Iron deficiency anemia, unspecified: Secondary | ICD-10-CM | POA: Insufficient documentation

## 2013-10-05 DIAGNOSIS — Z8742 Personal history of other diseases of the female genital tract: Secondary | ICD-10-CM | POA: Diagnosis not present

## 2013-10-05 DIAGNOSIS — M7989 Other specified soft tissue disorders: Secondary | ICD-10-CM | POA: Diagnosis present

## 2013-10-05 DIAGNOSIS — Z8719 Personal history of other diseases of the digestive system: Secondary | ICD-10-CM | POA: Insufficient documentation

## 2013-10-05 DIAGNOSIS — Z3202 Encounter for pregnancy test, result negative: Secondary | ICD-10-CM | POA: Insufficient documentation

## 2013-10-05 DIAGNOSIS — Z8619 Personal history of other infectious and parasitic diseases: Secondary | ICD-10-CM | POA: Insufficient documentation

## 2013-10-05 DIAGNOSIS — Z8673 Personal history of transient ischemic attack (TIA), and cerebral infarction without residual deficits: Secondary | ICD-10-CM | POA: Insufficient documentation

## 2013-10-05 DIAGNOSIS — N949 Unspecified condition associated with female genital organs and menstrual cycle: Secondary | ICD-10-CM | POA: Diagnosis not present

## 2013-10-05 DIAGNOSIS — N925 Other specified irregular menstruation: Secondary | ICD-10-CM | POA: Diagnosis not present

## 2013-10-05 DIAGNOSIS — Z79899 Other long term (current) drug therapy: Secondary | ICD-10-CM | POA: Diagnosis not present

## 2013-10-05 DIAGNOSIS — Z8659 Personal history of other mental and behavioral disorders: Secondary | ICD-10-CM | POA: Diagnosis not present

## 2013-10-05 DIAGNOSIS — N938 Other specified abnormal uterine and vaginal bleeding: Secondary | ICD-10-CM | POA: Insufficient documentation

## 2013-10-05 DIAGNOSIS — Z87891 Personal history of nicotine dependence: Secondary | ICD-10-CM | POA: Diagnosis not present

## 2013-10-05 LAB — ABO/RH: ABO/RH(D): O POS

## 2013-10-05 MED ORDER — SODIUM CHLORIDE 0.9 % IV SOLN
INTRAVENOUS | Status: DC
  Administered 2013-10-05: 14:00:00 via INTRAVENOUS

## 2013-10-05 MED ORDER — SODIUM CHLORIDE 0.9 % IV SOLN
510.0000 mg | Freq: Once | INTRAVENOUS | Status: AC
  Administered 2013-10-05: 510 mg via INTRAVENOUS
  Filled 2013-10-05: qty 17

## 2013-10-05 NOTE — ED Notes (Signed)
Pt sts she would like to talk to doctor before we start an IV and blood draws, to find out if there are any alternatives to blood transfusion.

## 2013-10-05 NOTE — ED Notes (Signed)
Pt was seen here yesterday for bilat.leg swelling and was told needs blood transfusion. Pt left AMA to find care for children and is now returning for blood work and possible admission.

## 2013-10-05 NOTE — ED Notes (Signed)
Bed: WA08 Expected date:  Expected time:  Means of arrival:  Comments: 

## 2013-10-05 NOTE — ED Provider Notes (Signed)
CSN: [id number]     Arrival date & time 10/05/13  1047 History   First MD Initiated Contact with Patient 10/05/13 1120     Chief Complaint  Patient presents with  . Abnormal Lab  . Leg Swelling     (Consider location/radiation/quality/duration/timing/severity/associated sxs/prior Treatment) HPI Patient reports she has a long history of having heavy periods. She states she knows she has fibroids. She states she has been advised to have a hysterectomy, but has refused. She has had anemia in the past and during her last pregnancy she was advised she needed a blood transfusion which she refused. She also states she does not take iron pills because they make her feel bad. She was seen in ED yesterday and was evaluated and was found to be very anemic. Patient left AMA at that time because she needed to arrange childcare for her 4 children. She returns to the ED today. Patient has had swelling in her legs, stating the right is worse than the left. She did have Doppler ultrasounds of both legs done yesterday that were negative. She reports getting increasing weakness and dizziness for the past 3 weeks. She now has dyspnea on exertion when she walks up and down stairs. She also is noted having difficulty picking up her 71-1/2-year-old child. She also reports starting get some chest discomfort that she relates to being gas. She states she had it yesterday also. She denies any nausea, vomiting, but she does describe craving ice. She denies any recent traveling. She has not been bed ridden. Patient is G5 P4 Ab1. She is currently on her period. She states her periods typically last 7 days with 5 days of extremely heavy bleeding and clots.   PCP none GYN [facility]  Past Medical History  Diagnosis Date  . GERD (gastroesophageal reflux disease)   . Complication of anesthesia     BAck pain after epidural 2007  . Heart palpitations     FEW X PER WEEK  . Depression 2000    MEDS IN PAST  .  Abnormal Pap smear     COLPO;   LEEP 2003  . Infection     CHLAMYDIA?  Marland Kitchen Infection     HERPES  . Infection     TRICH  . Infection     YEAST; FREQUENT WITH NUIVAring  . Infection     UTI OCC  . High blood pressure 2012    ON MED  . Anemia HX  IN TEENS    ON FE  . Fibroid   . IBS (irritable bowel syndrome)   . Head injury AS TEEN  . 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  . Migraines     OCULAR MIGRAINES;  HAS HX LOSS FO VISION   Past Surgical History  Procedure Laterality Date  . Laparoscopy    . Tonsillectomy  2002  . Wisdom tooth extraction  2005  . Induced abortion  1999   Family History  Problem Relation Age of Onset  . Migraines Mother   . Heart disease Father     MI  . Mental illness Father   . Hypertension Maternal Aunt   . Cancer Maternal Aunt     breast  . Hypertension Maternal Uncle   . Mental illness Paternal Aunt   . Mental illness Paternal Uncle   . Heart disease Paternal Uncle   . Thyroid disease Maternal Grandmother   . Kidney disease  Maternal Grandmother     DIALYSIS  . Hypertension Maternal Grandmother   . Arthritis Maternal Grandmother   . Cancer Maternal Grandmother   . Hypertension Maternal Grandfather   . Diabetes Maternal Grandfather   . Arthritis Maternal Grandfather   . Alcohol abuse Maternal Grandfather   . Stroke Maternal Grandfather   . Mental illness Paternal Grandfather   . Cancer Cousin     breast  . Asthma Cousin   . Mental illness Cousin    History  Substance Use Topics  . Smoking status: Former Smoker    Types: Cigarettes    Quit date: 08/30/2010  . Smokeless tobacco: Never Used  . Alcohol Use: Yes     Comment: [employer]   Employed  OB History   Grav Para Term Preterm Abortions TAB SAB Ect Mult Living   5 4 4  1     4      Review of Systems  All other systems reviewed and are negative.     Allergies  Sucralose  Home Medications   Prior to Admission medications    Medication Sig Start Date End Date Taking? Authorizing Provider  acyclovir (ZOVIRAX) 400 MG tablet Three times daily x 7 days if outbreak 03/09/12  Yes Dede Query Rivard, MD  ibuprofen (ADVIL,MOTRIN) 200 MG tablet Take 200 mg by mouth once as needed for cramping.   Yes Historical Provider, MD  Multiple Vitamin (MULTIVITAMIN WITH MINERALS) TABS tablet Take 1 tablet by mouth daily.   Yes Historical Provider, MD   BP 141/85  Pulse 64  Temp(Src) 98 F (36.7 C) (Oral)  Resp 18  SpO2 100%  LMP 10/04/2013  Vital signs normal   Physical Exam  Nursing note and vitals reviewed. Constitutional: She is oriented to person, place, and time. She appears well-developed and well-nourished.  Non-toxic appearance. She does not appear ill. No distress.  HENT:  Head: Normocephalic and atraumatic.  Right Ear: External ear normal.  Left Ear: External ear normal.  Nose: Nose normal. No mucosal edema or rhinorrhea.  Mouth/Throat: Oropharynx is clear and moist and mucous membranes are normal. No dental abscesses or uvula swelling.  Eyes: Conjunctivae and EOM are normal. Pupils are equal, round, and reactive to light.  Neck: Normal range of motion and full passive range of motion without pain. Neck supple.  Cardiovascular: Normal rate, regular rhythm and normal heart sounds.  Exam reveals no gallop and no friction rub.   No murmur heard. Pulmonary/Chest: Effort normal and breath sounds normal. No respiratory distress. She has no wheezes. She has no rhonchi. She has no rales. She exhibits no tenderness and no crepitus.  Abdominal: Soft. Normal appearance and bowel sounds are normal. She exhibits no distension. There is no tenderness. There is no rebound and no guarding.  Musculoskeletal: Normal range of motion. She exhibits no edema and no tenderness.  Moves all extremities well.   Neurological: She is alert and oriented to person, place, and time. She has normal strength. No cranial nerve deficit.  Skin: Skin  is warm, dry and intact. No rash noted. No erythema. There is pallor.  Pale conjunctiva, pale mucus membranes in mouth, pale palms  Psychiatric: She has a normal mood and affect. Her speech is normal and behavior is normal. Her mood appears not anxious.    ED Course  Procedures (including critical care time)  Medications  0.9 %  sodium chloride infusion ( Intravenous New Bag/Given 10/05/13 1415)  ferumoxytol (FERAHEME) 510 mg in sodium chloride 0.9 %  100 mL IVPB (510 mg Intravenous Given 10/05/13 1424)     Pt refuses to have a blood transfusion so repeat blood work was not done today. We discussed she needs to take iron which can take 6 weeks to see an improvement. He also discussed her dyspnea on exertion and chest discomfort is most likely related to her anemia and is causing a stress to her body. However she is very adamant she does not want a blood transfusion. She states "what would she do if I was a Jehovah's Witness?" We reviewed her lab work from yesterday which now has the results of her anemia panel showing a significant iron deficiency anemia.  13:13 Dr Roselie Awkward, teaching service, called by secretary by mistake, suggests given [facility]. I talked to the assistant nursing director and it can be given in the ED.   I discussed with pt that the ferraheme has a black box warning about allergic reactions. Pt is agreeable to have it done.   13:38 Dr Raphael Gibney, discussed patient, he stresses to have patient follow up with them in the office and they will track her anemia.    Labs Review Results for orders placed during the hospital encounter of 10/04/13  CBC      Result Value Ref Range   WBC 4.6  4.0 - 10.5 K/uL   RBC 3.38 (*) 3.87 - 5.11 MIL/uL   Hemoglobin 6.8 (*) 12.0 - 15.0 g/dL   HCT 23.6 (*) 36.0 - 46.0 %   MCV 69.8 (*) 78.0 - 100.0 fL   MCH 20.1 (*) 26.0 - 34.0 pg   MCHC 28.8 (*) 30.0 - 36.0 g/dL   RDW 18.1 (*) 11.5 - 15.5 %   Platelets 163  150 - 400 K/uL  BASIC METABOLIC  PANEL      Result Value Ref Range   Sodium 139  137 - 147 mEq/L   Potassium 3.7  3.7 - 5.3 mEq/L   Chloride 103  96 - 112 mEq/L   CO2 21  19 - 32 mEq/L   Glucose, Bld 77  70 - 99 mg/dL   BUN 11  6 - 23 mg/dL   Creatinine, Ser 0.80  0.50 - 1.10 mg/dL   Calcium 9.1  8.4 - 10.5 mg/dL   GFR calc non Af Amer >90  >90 mL/min   GFR calc Af Amer >90  >90 mL/min   Anion gap 15  5 - 15  URINALYSIS, ROUTINE W REFLEX MICROSCOPIC      Result Value Ref Range   Color, Urine YELLOW  YELLOW   APPearance CLEAR  CLEAR   Specific Gravity, Urine 1.013  1.005 - 1.030   pH 6.5  5.0 - 8.0   Glucose, UA NEGATIVE  NEGATIVE mg/dL   Hgb urine dipstick NEGATIVE  NEGATIVE   Bilirubin Urine NEGATIVE  NEGATIVE   Ketones, ur NEGATIVE  NEGATIVE mg/dL   Protein, ur NEGATIVE  NEGATIVE mg/dL   Urobilinogen, UA 0.2  0.0 - 1.0 mg/dL   Nitrite NEGATIVE  NEGATIVE   Leukocytes, UA NEGATIVE  NEGATIVE  PRO B NATRIURETIC PEPTIDE      Result Value Ref Range   Pro B Natriuretic peptide (BNP) 104.9  0 - 125 pg/mL  TSH      Result Value Ref Range   TSH 0.773  0.350 - 4.500 uIU/mL  MAGNESIUM      Result Value Ref Range   Magnesium 2.2  1.5 - 2.5 mg/dL  VITAMIN B12  Result Value Ref Range   Vitamin B-12 884  211 - 911 pg/mL  FOLATE      Result Value Ref Range   Folate >20.0    IRON AND TIBC      Result Value Ref Range   Iron 16 (*) 42 - 135 ug/dL   TIBC 555 (*) 250 - 470 ug/dL   Saturation Ratios 3 (*) 20 - 55 %   UIBC 539 (*) 125 - 400 ug/dL  FERRITIN      Result Value Ref Range   Ferritin 2 (*) 10 - 291 ng/mL  RETICULOCYTES      Result Value Ref Range   Retic Ct Pct 1.1  0.4 - 3.1 %   RBC. 3.27 (*) 3.87 - 5.11 MIL/uL   Retic Count, Manual 36.0  19.0 - 186.0 K/uL  POC URINE PREG, ED      Result Value Ref Range   Preg Test, Ur NEGATIVE  NEGATIVE  I-STAT TROPOININ, ED      Result Value Ref Range   Troponin i, poc 0.00  0.00 - 0.08 ng/mL   Comment 3           TYPE AND SCREEN      Result Value Ref  Range   ABO/RH(D) O POS     Antibody Screen NEG     Sample Expiration 10/07/2013    ABO/RH      Result Value Ref Range   ABO/RH(D) O POS      Patient has iron deficiency anemia.    Imaging Review Dg Chest 2 View  10/04/2013   CLINICAL DATA:  Chest pain and hypertension  EXAM: CHEST  2 VIEW  COMPARISON:  None.  FINDINGS: Lungs are clear. Heart size and pulmonary vascularity are normal. No adenopathy. No pneumothorax. No bone lesions.  IMPRESSION: No abnormality noted.   Electronically Signed   By: Lowella Grip M.D.   On: 10/04/2013 16:36     EKG Interpretation None      MDM   Final diagnoses:  Iron deficiency anemia  DUB (dysfunctional uterine bleeding)    Plan discharge  Rolland Porter, MD, Alanson Aly, MD 10/05/13 1540

## 2013-10-05 NOTE — Discharge Instructions (Signed)
Take ferrous sequels OTC 3 times a day for at least 2 months. Call Dr Doran Stabler office to be rechecked in about 2 weeks. Return to the ED if you get worse, such as worsening weakness, chest pain and you want to get a transfusion.     Iron Deficiency Anemia Anemia is a condition in which there are less red blood cells or hemoglobin in the blood than normal. Hemoglobin is the part of red blood cells that carries oxygen. Iron deficiency anemia is anemia caused by too little iron. It is the most common type of anemia. It may leave you tired and short of breath. CAUSES   Lack of iron in the diet.  Poor absorption of iron, as seen with intestinal disorders.  Intestinal bleeding.  Heavy periods. SIGNS AND SYMPTOMS  Mild anemia may not be noticeable. Symptoms may include:  Fatigue.  Headache.  Pale skin.  Weakness.  Tiredness.  Shortness of breath.  Dizziness.  Cold hands and feet.  Fast or irregular heartbeat. DIAGNOSIS  Diagnosis requires a thorough evaluation and physical exam by your health care provider. Blood tests are generally used to confirm iron deficiency anemia. Additional tests may be done to find the underlying cause of your anemia. These may include:  Testing for blood in the stool (fecal occult blood test).  A procedure to see inside the colon and rectum (colonoscopy).  A procedure to see inside the esophagus and stomach (endoscopy). TREATMENT  Iron deficiency anemia is treated by correcting the cause of the deficiency. Treatment may involve:  Adding iron-rich foods to your diet.  Taking iron supplements. Pregnant or breastfeeding women need to take extra iron because their normal diet usually does not provide the required amount.  Taking vitamins. Vitamin C improves the absorption of iron. Your health care provider may recommend that you take your iron tablets with a glass of orange juice or vitamin C supplement.  Medicines to make heavy menstrual flow  lighter.  Surgery. HOME CARE INSTRUCTIONS   Take iron as directed by your health care provider.  If you cannot tolerate taking iron supplements by mouth, talk to your health care provider about taking them through a vein (intravenously) or an injection into a muscle.  For the best iron absorption, iron supplements should be taken on an empty stomach. If you cannot tolerate them on an empty stomach, you may need to take them with food.  Do not drink milk or take antacids at the same time as your iron supplements. Milk and antacids may interfere with the absorption of iron.  Iron supplements can cause constipation. Make sure to include fiber in your diet to prevent constipation. A stool softener may also be recommended.  Take vitamins as directed by your health care provider.  Eat a diet rich in iron. Foods high in iron include liver, lean beef, whole-grain bread, eggs, dried fruit, and dark green leafy vegetables. SEEK IMMEDIATE MEDICAL CARE IF:   You faint. If this happens, do not drive. Call your local emergency services (911 in [place] if no other help is available.  You have chest pain.  You feel nauseous or vomit.  You have severe or increased shortness of breath with activity.  You feel weak.  You have a rapid heartbeat.  You have unexplained sweating.  You become light-headed when getting up from a chair or bed. MAKE SURE YOU:   Understand these instructions.  Will watch your condition.  Will get help right away if you are not  doing well or get worse. Document Released: 01/25/2000 Document Revised: 02/01/2013 Document Reviewed: 10/04/2012 [facility] Patient Information 2015 [place], [place]. This information is not intended to replace advice given to you by your health care provider. Make sure you discuss any questions you have with your health care provider.  Dysfunctional Uterine Bleeding Normally, menstrual periods begin between ages 26 to 30 in young women. A normal  menstrual cycle/period may begin every 23 days up to 35 days and lasts from 1 to 7 days. Around 12 to 14 days before your menstrual period starts, ovulation (ovary produces an egg) occurs. When counting the time between menstrual periods, count from the first day of bleeding of the previous period to the first day of bleeding of the next period. Dysfunctional (abnormal) uterine bleeding is bleeding that is different from a normal menstrual period. Your periods may come earlier or later than usual. They may be lighter, have blood clots or be heavier. You may have bleeding between periods, or you may skip one period or more. You may have bleeding after sexual intercourse, bleeding after menopause, or no menstrual period. CAUSES   Pregnancy (normal, miscarriage, tubal).  IUDs (intrauterine device, birth control).  Birth control pills.  Hormone treatment.  Menopause.  Infection of the cervix.  Blood clotting problems.  Infection of the inside lining of the uterus.  Endometriosis, inside lining of the uterus growing in the pelvis and other female organs.  Adhesions (scar tissue) inside the uterus.  Obesity or severe weight loss.  Uterine polyps inside the uterus.  Cancer of the vagina, cervix, or uterus.  Ovarian cysts or polycystic ovary syndrome.  Medical problems (diabetes, thyroid disease).  Uterine fibroids (noncancerous tumor).  Problems with your female hormones.  Endometrial hyperplasia, very thick lining and enlarged cells inside of the uterus.  Medicines that interfere with ovulation.  Radiation to the pelvis or abdomen.  Chemotherapy. DIAGNOSIS   Your doctor will discuss the history of your menstrual periods, medicines you are taking, changes in your weight, stress in your life, and any medical problems you may have.  Your doctor will do a physical and pelvic examination.  Your doctor may want to perform certain tests to make a diagnosis, such as:  Pap  test.  Blood tests.  Cultures for infection.  CT scan.  Ultrasound.  Hysteroscopy.  Laparoscopy.  MRI.  Hysterosalpingography.  D and C.  Endometrial biopsy. TREATMENT  Treatment will depend on the cause of the dysfunctional uterine bleeding (DUB). Treatment may include:  Observing your menstrual periods for a couple of months.  Prescribing medicines for medical problems, including:  Antibiotics.  Hormones.  Birth control pills.  Removing an IUD (intrauterine device, birth control).  Surgery:  D and C (scrape and remove tissue from inside the uterus).  Laparoscopy (examine inside the abdomen with a lighted tube).  Uterine ablation (destroy lining of the uterus with electrical current, laser, heat, or freezing).  Hysteroscopy (examine cervix and uterus with a lighted tube).  Hysterectomy (remove the uterus). HOME CARE INSTRUCTIONS   If medicines were prescribed, take exactly as directed. Do not change or switch medicines without consulting your caregiver.  Long term heavy bleeding may result in iron deficiency. Your caregiver may have prescribed iron pills. They help replace the iron that your body lost from heavy bleeding. Take exactly as directed.  Do not take aspirin or medicines that contain aspirin one week before or during your menstrual period. Aspirin may make the bleeding worse.  If you  need to change your sanitary pad or tampon more than once every 2 hours, stay in bed with your feet elevated and a cold pack on your lower abdomen. Rest as much as possible, until the bleeding stops or slows down.  Eat well-balanced meals. Eat foods high in iron. Examples are:  Leafy green vegetables.  Whole-grain breads and cereals.  Eggs.  Meat.  Liver.  Do not try to lose weight until the abnormal bleeding has stopped and your blood iron level is back to normal. Do not lift more than ten pounds or do strenuous activities when you are bleeding.  For a  couple of months, make note on your calendar, marking the start and ending of your period, and the type of bleeding (light, medium, heavy, spotting, clots or missed periods). This is for your caregiver to better evaluate your problem. SEEK MEDICAL CARE IF:   You develop nausea (feeling sick to your stomach) and vomiting, dizziness, or diarrhea while you are taking your medicine.  You are getting lightheaded or weak.  You have any problems that may be related to the medicine you are taking.  You develop pain with your DUB.  You want to remove your IUD.  You want to stop or change your birth control pills or hormones.  You have any type of abnormal bleeding mentioned above.  You are over 67 years old and have not had a menstrual period yet.  You are 38 years old and you are still having menstrual periods.  You have any of the symptoms mentioned above.  You develop a rash. SEEK IMMEDIATE MEDICAL CARE IF:   An oral temperature above 102 F (38.9 C) develops.  You develop chills.  You are changing your sanitary pad or tampon more than once an hour.  You develop abdominal pain.  You pass out or faint. Document Released: 01/25/2000 Document Revised: 04/21/2011 Document Reviewed: 12/26/2008 [facility] Patient Information 2015 [place], [place]. This information is not intended to replace advice given to you by your health care provider. Make sure you discuss any questions you have with your health care provider.

## 2013-10-07 ENCOUNTER — Telehealth: Payer: Self-pay | Admitting: Hematology and Oncology

## 2013-10-07 NOTE — ED Provider Notes (Signed)
Medical screening examination/treatment/procedure(s) were conducted as a shared visit with non-physician practitioner(s) and myself.  I personally evaluated the patient during the encounter.   EKG Interpretation   Date/Time:  Tuesday October 04 2013 15:56:00 EDT Ventricular Rate:  59 PR Interval:  109 QRS Duration: 84 QT Interval:  412 QTC Calculation: 408 R Axis:   51 Text Interpretation:  Sinus rhythm Short PR interval Left ventricular  hypertrophy ED PHYSICIAN INTERPRETATION AVAILABLE IN [facility]  Confirmed by TEST, Record ([id number]) on 10/06/2013 11:45:50 AM      Patient presented for bilateral lower extremity swelling. She reports symptoms have been progressively worsening over the last few days. Workup initiated for CHF versus DVT. She ultimately found to be severely anemic, declined admission, left AMA.  Orpah Greek, MD 10/07/13 [phone]

## 2013-10-07 NOTE — Telephone Encounter (Signed)
S/W PATIENT AND GAVE NP APPT FOR 08/31 @ 2 W/DR. Sandoval.

## 2013-10-10 ENCOUNTER — Other Ambulatory Visit (HOSPITAL_BASED_OUTPATIENT_CLINIC_OR_DEPARTMENT_OTHER): Payer: Managed Care, Other (non HMO)

## 2013-10-10 ENCOUNTER — Ambulatory Visit (HOSPITAL_BASED_OUTPATIENT_CLINIC_OR_DEPARTMENT_OTHER): Payer: Managed Care, Other (non HMO) | Admitting: Hematology and Oncology

## 2013-10-10 ENCOUNTER — Telehealth: Payer: Self-pay | Admitting: Hematology and Oncology

## 2013-10-10 ENCOUNTER — Encounter: Payer: Self-pay | Admitting: Hematology and Oncology

## 2013-10-10 ENCOUNTER — Telehealth: Payer: Self-pay | Admitting: *Deleted

## 2013-10-10 ENCOUNTER — Ambulatory Visit: Payer: Managed Care, Other (non HMO)

## 2013-10-10 VITALS — BP 146/94 | HR 69 | Temp 98.5°F | Resp 18 | Ht 64.0 in | Wt 152.7 lb

## 2013-10-10 DIAGNOSIS — D509 Iron deficiency anemia, unspecified: Secondary | ICD-10-CM

## 2013-10-10 DIAGNOSIS — N920 Excessive and frequent menstruation with regular cycle: Secondary | ICD-10-CM | POA: Insufficient documentation

## 2013-10-10 DIAGNOSIS — N921 Excessive and frequent menstruation with irregular cycle: Secondary | ICD-10-CM

## 2013-10-10 LAB — CBC & DIFF AND RETIC
BASO%: 0.5 % (ref 0.0–2.0)
Basophils Absolute: 0 10*3/uL (ref 0.0–0.1)
EOS ABS: 0.1 10*3/uL (ref 0.0–0.5)
EOS%: 2.6 % (ref 0.0–7.0)
HCT: 25.7 % — ABNORMAL LOW (ref 34.8–46.6)
HGB: 7.2 g/dL — ABNORMAL LOW (ref 11.6–15.9)
Immature Retic Fract: 39.2 % — ABNORMAL HIGH (ref 1.60–10.00)
LYMPH#: 1.7 10*3/uL (ref 0.9–3.3)
LYMPH%: 31.8 % (ref 14.0–49.7)
MCH: 21.3 pg — ABNORMAL LOW (ref 25.1–34.0)
MCHC: 28 g/dL — ABNORMAL LOW (ref 31.5–36.0)
MCV: 76 fL — ABNORMAL LOW (ref 79.5–101.0)
MONO#: 0.5 10*3/uL (ref 0.1–0.9)
MONO%: 8.2 % (ref 0.0–14.0)
NEUT%: 56.9 % (ref 38.4–76.8)
NEUTROS ABS: 3.1 10*3/uL (ref 1.5–6.5)
PLATELETS: 225 10*3/uL (ref 145–400)
RBC: 3.38 10*6/uL — AB (ref 3.70–5.45)
RDW: 22 % — AB (ref 11.2–14.5)
RETIC %: 6.59 % — AB (ref 0.70–2.10)
RETIC CT ABS: 222.74 10*3/uL — AB (ref 33.70–90.70)
WBC: 5.5 10*3/uL (ref 3.9–10.3)

## 2013-10-10 NOTE — Assessment & Plan Note (Signed)
She has appointment to see her gynecologist for management. According to her, she had uterine fibroids. I recommend treatment for this because otherwise, she will need regular iron infusion until she becomes menopause.

## 2013-10-10 NOTE — Telephone Encounter (Signed)
Pt confirmed labs/ov per 08/31 POF, sent msg to add IV Friday am, gave pt AVS..Marland KitchenKJ

## 2013-10-10 NOTE — Telephone Encounter (Signed)
Per staff message and POF I have scheduled appts. Advised scheduler of appts. JMW  

## 2013-10-10 NOTE — Progress Notes (Signed)
[name] NOTE  Patient Care Team: Kristine Garbe, MD as PCP - General (Family Medicine)  CHIEF COMPLAINTS/PURPOSE OF CONSULTATION:  Severe iron deficiency anemia, likely due to chronic menorrhagia  HISTORY OF PRESENTING ILLNESS:  [name] 38 y.o. female is here because of severe iron deficiency anemia.  She was found to have abnormal CBC from routine blood work. On review of her CBC from 2011, she had been anemic with hemoglobin as low as 8.7. At that time, she was pregnant with her last child with history of pregnancy associated thrombocytopenia as well but never saw a hematologist. She complained of heavy menstruation of bleeding 7 days with irregular cycles. She was told she has uterine fibroids. She has 4 pregnancies and never received blood transfusion. Recently, she presented to the ER with significant fatigue and severe anemia and declined blood transfusion. She was given 510 milligrams of intravenous iron ferritin and tolerated that well. She denies recent chest pain on exertion, shortness of breath on minimal exertion, pre-syncopal episodes, or palpitations. She does complain of dizziness. She had not noticed any recent bleeding such as epistaxis, hematuria or hematochezia The patient denies over the counter NSAID ingestion. She is not on antiplatelets agents.  She had no prior history or diagnosis of cancer. Her age appropriate screening programs are up-to-date. She denies any pica and eats a variety of diet. She never received blood transfusion. She has donated blood long time ago. The patient was prescribed oral iron supplements and she takes one iron supplement daily.  MEDICAL HISTORY:  Past Medical History  Diagnosis Date  . GERD (gastroesophageal reflux disease)   . Complication of anesthesia     BAck pain after epidural 2007  . Heart palpitations     FEW X PER WEEK  . Depression 2000    MEDS IN PAST  . Abnormal Pap smear     COLPO;    LEEP 2003  . Infection     CHLAMYDIA?  Marland Kitchen Infection     HERPES  . Infection     TRICH  . Infection     YEAST; FREQUENT WITH NUIVAring  . Infection     UTI OCC  . High blood pressure 2012    ON MED  . Anemia HX  IN TEENS    ON FE  . Fibroid   . IBS (irritable bowel syndrome)   . Head injury AS TEEN  . 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  . Migraines     OCULAR MIGRAINES;  HAS HX LOSS FO VISION    SURGICAL HISTORY: Past Surgical History  Procedure Laterality Date  . Laparoscopy    . Tonsillectomy  2002  . Wisdom tooth extraction  2005  . Induced abortion  1999    SOCIAL HISTORY: History   Social History  . Marital Status: Single    Spouse Name: N/A    Number of Children: 3  . Years of Education: 16   Occupational History  . ADMINISTRATIVE ASSISTANT    Social History Main Topics  . Smoking status: Former Smoker    Types: Cigarettes    Quit date: 08/30/2010  . Smokeless tobacco: Never Used  . Alcohol Use: Yes     Comment: OCC  . Drug Use: No     Comment: MARIJUANA D/C 2007  . Sexual Activity: Not Currently    Partners: Male    Birth Control/ Protection: None  Other Topics Concern  . Not on file   Social History Narrative  . No narrative on file    FAMILY HISTORY: Family History  Problem Relation Age of Onset  . Migraines Mother   . Heart disease Father     MI  . Mental illness Father   . Hypertension Maternal Aunt   . Cancer Maternal Aunt     breast  . Hypertension Maternal Uncle   . Mental illness Paternal Aunt   . Mental illness Paternal Uncle   . Heart disease Paternal Uncle   . Thyroid disease Maternal Grandmother   . Kidney disease Maternal Grandmother     DIALYSIS  . Hypertension Maternal Grandmother   . Arthritis Maternal Grandmother   . Cancer Maternal Grandmother   . Hypertension Maternal Grandfather   . Diabetes Maternal Grandfather   . Arthritis Maternal Grandfather   . Alcohol abuse  Maternal Grandfather   . Stroke Maternal Grandfather   . Mental illness Paternal Grandfather   . Cancer Cousin     breast  . Asthma Cousin   . Mental illness Cousin     ALLERGIES:  is allergic to shrimp and sucralose.  MEDICATIONS:  Current Outpatient Prescriptions  Medication Sig Dispense Refill  . Iron Combinations (IRON COMPLEX PO) Take by mouth.      Marland Kitchen acyclovir (ZOVIRAX) 400 MG tablet Three times daily x 7 days if outbreak  28 tablet  11  . ibuprofen (ADVIL,MOTRIN) 200 MG tablet Take 200 mg by mouth once as needed for cramping.      . Multiple Vitamin (MULTIVITAMIN WITH MINERALS) TABS tablet Take 1 tablet by mouth daily.       No current facility-administered medications for this visit.    REVIEW OF SYSTEMS:   Constitutional: Denies fevers, chills or abnormal night sweats Eyes: Denies blurriness of vision, double vision or watery eyes Ears, nose, mouth, throat, and face: Denies mucositis or sore throat Respiratory: Denies cough, dyspnea or wheezes Cardiovascular: Denies palpitation, chest discomfort or lower extremity swelling Gastrointestinal:  Denies nausea, heartburn or change in bowel habits Skin: Denies abnormal skin rashes Lymphatics: Denies new lymphadenopathy or easy bruising Neurological:Denies numbness, tingling or new weaknesses Behavioral/Psych: Mood is stable, no new changes  All other systems were reviewed with the patient and are negative.  PHYSICAL EXAMINATION: ECOG PERFORMANCE STATUS: 1 - Symptomatic but completely ambulatory  Filed Vitals:   10/10/13 1424  BP: 146/94  Pulse: 69  Temp: 98.5 F (36.9 C)  Resp: 18   Filed Weights   10/10/13 1424  Weight: 152 lb 11.2 oz (69.264 kg)    GENERAL:alert, no distress and comfortable SKIN: skin color, texture, turgor are normal, no rashes or significant lesions EYES: normal, conjunctiva are pale and non-injected, sclera clear OROPHARYNX:no exudate, no erythema and lips, buccal mucosa, and tongue  normal  NECK: supple, thyroid normal size, non-tender, without nodularity LYMPH:  no palpable lymphadenopathy in the cervical, axillary or inguinal LUNGS: clear to auscultation and percussion with normal breathing effort HEART: regular rate & rhythm and no murmurs and no lower extremity edema ABDOMEN:abdomen soft, non-tender and normal bowel sounds Musculoskeletal:no cyanosis of digits and no clubbing  PSYCH: alert & oriented x 3 with fluent speech NEURO: no focal motor/sensory deficits  LABORATORY DATA:  I have reviewed the data as listed Recent Results (from the past 2160 hour(s))  CBC     Status: Abnormal   Collection Time    10/04/13  3:38 PM  Result Value Ref Range   WBC 4.6  4.0 - 10.5 K/uL   RBC 3.38 (*) 3.87 - 5.11 MIL/uL   Hemoglobin 6.8 (*) 12.0 - 15.0 g/dL   Comment: CRITICAL RESULT CALLED TO, READ BACK BY AND VERIFIED WITH:     LEDWELL,A @ 1631 ON [id number] BY POTEAT,S   HCT 23.6 (*) 36.0 - 46.0 %   MCV 69.8 (*) 78.0 - 100.0 fL   MCH 20.1 (*) 26.0 - 34.0 pg   MCHC 28.8 (*) 30.0 - 36.0 g/dL   RDW 18.1 (*) 11.5 - 15.5 %   Platelets 163  150 - 400 K/uL  BASIC METABOLIC PANEL     Status: None   Collection Time    10/04/13  3:38 PM      Result Value Ref Range   Sodium 139  137 - 147 mEq/L   Potassium 3.7  3.7 - 5.3 mEq/L   Chloride 103  96 - 112 mEq/L   CO2 21  19 - 32 mEq/L   Glucose, Bld 77  70 - 99 mg/dL   BUN 11  6 - 23 mg/dL   Creatinine, Ser 0.80  0.50 - 1.10 mg/dL   Calcium 9.1  8.4 - 10.5 mg/dL   GFR calc non Af Amer >90  >90 mL/min   GFR calc Af Amer >90  >90 mL/min   Comment: (NOTE)     The eGFR has been calculated using the CKD EPI equation.     This calculation has not been validated in all clinical situations.     eGFR's persistently <90 mL/min signify possible Chronic Kidney     Disease.   Anion gap 15  5 - 15  PRO B NATRIURETIC PEPTIDE     Status: None   Collection Time    10/04/13  3:38 PM      Result Value Ref Range   Pro B Natriuretic  peptide (BNP) 104.9  0 - 125 pg/mL  TSH     Status: None   Collection Time    10/04/13  3:38 PM      Result Value Ref Range   TSH 0.773  0.350 - 4.500 uIU/mL   Comment: Performed at [facility]     Status: None   Collection Time    10/04/13  3:38 PM      Result Value Ref Range   Magnesium 2.2  1.5 - 2.5 mg/dL  Randolm Idol, ED     Status: None   Collection Time    10/04/13  3:54 PM      Result Value Ref Range   Troponin i, poc 0.00  0.00 - 0.08 ng/mL   Comment 3            Comment: Due to the release kinetics of cTnI,     a negative result within the first hours     of the onset of symptoms does not rule out     myocardial infarction with certainty.     If myocardial infarction is still suspected,     repeat the test at appropriate intervals.  URINALYSIS, ROUTINE W REFLEX MICROSCOPIC     Status: None   Collection Time    10/04/13  3:58 PM      Result Value Ref Range   Color, Urine YELLOW  YELLOW   APPearance CLEAR  CLEAR   Specific Gravity, Urine 1.013  1.005 - 1.030   pH 6.5  5.0 - 8.0  Glucose, UA NEGATIVE  NEGATIVE mg/dL   Hgb urine dipstick NEGATIVE  NEGATIVE   Bilirubin Urine NEGATIVE  NEGATIVE   Ketones, ur NEGATIVE  NEGATIVE mg/dL   Protein, ur NEGATIVE  NEGATIVE mg/dL   Urobilinogen, UA 0.2  0.0 - 1.0 mg/dL   Nitrite NEGATIVE  NEGATIVE   Leukocytes, UA NEGATIVE  NEGATIVE   Comment: MICROSCOPIC NOT DONE ON URINES WITH NEGATIVE PROTEIN, BLOOD, LEUKOCYTES, NITRITE, OR GLUCOSE <1000 mg/dL.  POC URINE PREG, ED     Status: None   Collection Time    10/04/13  4:05 PM      Result Value Ref Range   Preg Test, Ur NEGATIVE  NEGATIVE   Comment:            THE SENSITIVITY OF THIS     METHODOLOGY IS >24 mIU/mL  TYPE AND SCREEN     Status: None   Collection Time    10/04/13  5:23 PM      Result Value Ref Range   ABO/RH(D) O POS     Antibody Screen NEG     Sample Expiration 10/07/2013    ABO/RH     Status: None   Collection Time    10/04/13   5:23 PM      Result Value Ref Range   ABO/RH(D) O POS    VITAMIN B12     Status: None   Collection Time    10/04/13  5:26 PM      Result Value Ref Range   Vitamin B-12 884  211 - 911 pg/mL   Comment: Performed at [facility]     Status: None   Collection Time    10/04/13  5:26 PM      Result Value Ref Range   Folate >20.0     Comment: (NOTE)     Reference Ranges            Deficient:       0.4 - 3.3 ng/mL            Indeterminate:   3.4 - 5.4 ng/mL            Normal:              > 5.4 ng/mL     Performed at [facility]     Status: Abnormal   Collection Time    10/04/13  5:26 PM      Result Value Ref Range   Iron 16 (*) 42 - 135 ug/dL   TIBC 555 (*) 250 - 470 ug/dL   Saturation Ratios 3 (*) 20 - 55 %   UIBC 539 (*) 125 - 400 ug/dL   Comment: Performed at [facility]     Status: Abnormal   Collection Time    10/04/13  5:26 PM      Result Value Ref Range   Ferritin 2 (*) 10 - 291 ng/mL   Comment: Performed at [facility]     Status: Abnormal   Collection Time    10/04/13  5:26 PM      Result Value Ref Range   Retic Ct Pct 1.1  0.4 - 3.1 %   RBC. 3.27 (*) 3.87 - 5.11 MIL/uL   Retic Count, Manual 36.0  19.0 - 186.0 K/uL  CBC & DIFF AND RETIC     Status: Abnormal   Collection Time    10/10/13  3:01 PM  Result Value Ref Range   WBC 5.5  3.9 - 10.3 10e3/uL   NEUT# 3.1  1.5 - 6.5 10e3/uL   HGB 7.2 (*) 11.6 - 15.9 g/dL   HCT 25.7 (*) 34.8 - 46.6 %   Platelets 225  145 - 400 10e3/uL   MCV 76.0 (*) 79.5 - 101.0 fL   MCH 21.3 (*) 25.1 - 34.0 pg   MCHC 28.0 (*) 31.5 - 36.0 g/dL   RBC 3.38 (*) 3.70 - 5.45 10e6/uL   RDW 22.0 (*) 11.2 - 14.5 %   lymph# 1.7  0.9 - 3.3 10e3/uL   MONO# 0.5  0.1 - 0.9 10e3/uL   Eosinophils Absolute 0.1  0.0 - 0.5 10e3/uL   Basophils Absolute 0.0  0.0 - 0.1 10e3/uL   NEUT% 56.9  38.4 - 76.8 %   LYMPH% 31.8  14.0 - 49.7 %   MONO% 8.2  0.0 - 14.0 %   EOS%  2.6  0.0 - 7.0 %   BASO% 0.5  0.0 - 2.0 %   Retic % 6.59 (*) 0.70 - 2.10 %   Retic Ct Abs 222.74 (*) 33.70 - 90.70 10e3/uL   Immature Retic Fract 39.20 (*) 1.60 - 10.00 %    RADIOGRAPHIC STUDIES: I have personally reviewed the radiological images as listed and agreed with the findings in the report. Dg Chest 2 View  10/04/2013   CLINICAL DATA:  Chest pain and hypertension  EXAM: CHEST  2 VIEW  COMPARISON:  None.  FINDINGS: Lungs are clear. Heart size and pulmonary vascularity are normal. No adenopathy. No pneumothorax. No bone lesions.  IMPRESSION: No abnormality noted.   Electronically Signed   By: Lowella Grip M.D.   On: 10/04/2013 16:36    ASSESSMENT & PLAN:  Iron deficiency anemia, unspecified The most likely cause of her anemia is due to chronic blood loss from menorrhagia. We discussed some of the risks, benefits, and alternatives of intravenous iron infusions. The patient is symptomatic from anemia and the iron level is critically low. She tolerated oral iron supplement poorly and desires to achieved higher levels of iron faster for adequate hematopoesis. Some of the side-effects to be expected including risks of infusion reactions, phlebitis, headaches, nausea and fatigue.  The patient is willing to proceed. Patient education material was dispensed.  Goal is to keep ferritin level greater than 50 I plan to redraw baseline today, iron infusion on Friday and recheck CBC and ferritin next month. The patient has declined blood transfusion.  Menorrhagia She has appointment to see her gynecologist for management. According to her, she had uterine fibroids. I recommend treatment for this because otherwise, she will need regular iron infusion until she becomes menopause.    All questions were answered. The patient knows to call the clinic with any problems, questions or concerns. I spent 40 minutes counseling the patient face to face. The total time spent in the appointment was 55  minutes and more than 50% was on counseling.     [facility], [place], MD 10/10/2013 4:07 PM

## 2013-10-10 NOTE — Progress Notes (Signed)
Checked in new pt with no financial concerns.  Pt is here for a hematology issue so no financial assistance is available at this time. I gave her Raquel's card for any questions or concerns.

## 2013-10-10 NOTE — Assessment & Plan Note (Signed)
The most likely cause of her anemia is due to chronic blood loss from menorrhagia. We discussed some of the risks, benefits, and alternatives of intravenous iron infusions. The patient is symptomatic from anemia and the iron level is critically low. She tolerated oral iron supplement poorly and desires to achieved higher levels of iron faster for adequate hematopoesis. Some of the side-effects to be expected including risks of infusion reactions, phlebitis, headaches, nausea and fatigue.  The patient is willing to proceed. Patient education material was dispensed.  Goal is to keep ferritin level greater than 50 I plan to redraw baseline today, iron infusion on Friday and recheck CBC and ferritin next month. The patient has declined blood transfusion.

## 2013-10-11 LAB — FERRITIN CHCC: FERRITIN: 351 ng/mL — AB (ref 9–269)

## 2013-10-14 ENCOUNTER — Ambulatory Visit (HOSPITAL_BASED_OUTPATIENT_CLINIC_OR_DEPARTMENT_OTHER): Payer: Managed Care, Other (non HMO)

## 2013-10-14 VITALS — BP 132/73 | HR 67 | Temp 98.1°F | Resp 18

## 2013-10-14 DIAGNOSIS — D509 Iron deficiency anemia, unspecified: Secondary | ICD-10-CM

## 2013-10-14 DIAGNOSIS — N920 Excessive and frequent menstruation with regular cycle: Secondary | ICD-10-CM

## 2013-10-14 MED ORDER — SODIUM CHLORIDE 0.9 % IV SOLN
Freq: Once | INTRAVENOUS | Status: AC
  Administered 2013-10-14: 10:00:00 via INTRAVENOUS

## 2013-10-14 MED ORDER — SODIUM CHLORIDE 0.9 % IV SOLN
1020.0000 mg | Freq: Once | INTRAVENOUS | Status: AC
  Administered 2013-10-14: 1020 mg via INTRAVENOUS
  Filled 2013-10-14: qty 34

## 2013-10-14 NOTE — Patient Instructions (Signed)

## 2013-11-09 ENCOUNTER — Telehealth: Payer: Self-pay | Admitting: *Deleted

## 2013-11-09 NOTE — Telephone Encounter (Signed)
I can see her 10/6 at 145 pm with labs

## 2013-11-09 NOTE — Telephone Encounter (Signed)
Pt left VM this morning asking if she can see Dr. Lurlean Leyden sooner than Mid October as scheduled because she is having some swelling again. Called pt back this afternoon and she reports swelling in right leg larger than left x one day.  State does have some pain in right calf.  She says she was told by ED MD that the swelling is caused or r/t the anemia.  Informed pt unsure how this would be related but she does need to go to ED or urgent care to r/o blood clot.  Pt states they checked her blood clot in ED last time and she did not have one.  Encouraged pt to call her PCP and report symptoms today or go to ED/Urgent care to be evaluated.  She verbalized understanding.  She still wants to know if Dr. Alvy Bimler can see her for her anemia sooner than Mid October as scheduled? She has ongoing fatigue and feels like she needs another iron infusion.

## 2013-11-10 ENCOUNTER — Telehealth: Payer: Self-pay | Admitting: *Deleted

## 2013-11-10 NOTE — Telephone Encounter (Signed)
Left VM for pt requesting she call nurse back.  She had requested appt to see Dr. Alvy Bimler and POF sent to see her next week.

## 2013-11-11 ENCOUNTER — Telehealth: Payer: Self-pay | Admitting: Hematology and Oncology

## 2013-11-11 NOTE — Telephone Encounter (Signed)
s.w. pt and advised on 10.6 appt...pt ok and aware °

## 2013-11-15 ENCOUNTER — Telehealth: Payer: Self-pay | Admitting: *Deleted

## 2013-11-15 ENCOUNTER — Other Ambulatory Visit (HOSPITAL_BASED_OUTPATIENT_CLINIC_OR_DEPARTMENT_OTHER): Payer: Managed Care, Other (non HMO)

## 2013-11-15 ENCOUNTER — Ambulatory Visit (HOSPITAL_BASED_OUTPATIENT_CLINIC_OR_DEPARTMENT_OTHER): Payer: Managed Care, Other (non HMO) | Admitting: Hematology and Oncology

## 2013-11-15 ENCOUNTER — Other Ambulatory Visit (HOSPITAL_COMMUNITY): Payer: Self-pay | Admitting: Obstetrics & Gynecology

## 2013-11-15 ENCOUNTER — Encounter: Payer: Self-pay | Admitting: Hematology and Oncology

## 2013-11-15 ENCOUNTER — Ambulatory Visit (HOSPITAL_COMMUNITY)
Admission: RE | Admit: 2013-11-15 | Discharge: 2013-11-15 | Disposition: A | Discharge status: Home / Self Care | Payer: Managed Care, Other (non HMO) | Source: Ambulatory Visit | Attending: Hematology and Oncology | Admitting: Hematology and Oncology

## 2013-11-15 VITALS — BP 150/91 | HR 55 | Temp 98.4°F | Resp 18 | Ht 64.0 in | Wt 156.5 lb

## 2013-11-15 DIAGNOSIS — R600 Localized edema: Secondary | ICD-10-CM

## 2013-11-15 DIAGNOSIS — M79661 Pain in right lower leg: Secondary | ICD-10-CM

## 2013-11-15 DIAGNOSIS — F17200 Nicotine dependence, unspecified, uncomplicated: Secondary | ICD-10-CM

## 2013-11-15 DIAGNOSIS — Z720 Tobacco use: Secondary | ICD-10-CM | POA: Diagnosis not present

## 2013-11-15 DIAGNOSIS — D509 Iron deficiency anemia, unspecified: Secondary | ICD-10-CM

## 2013-11-15 DIAGNOSIS — M7989 Other specified soft tissue disorders: Principal | ICD-10-CM

## 2013-11-15 HISTORY — DX: Localized edema: R60.0

## 2013-11-15 LAB — CBC & DIFF AND RETIC
BASO%: 0.8 % (ref 0.0–2.0)
Basophils Absolute: 0 10*3/uL (ref 0.0–0.1)
EOS%: 1.5 % (ref 0.0–7.0)
Eosinophils Absolute: 0.1 10*3/uL (ref 0.0–0.5)
HCT: 37.9 % (ref 34.8–46.6)
HGB: 12 g/dL (ref 11.6–15.9)
IMMATURE RETIC FRACT: 2 % (ref 1.60–10.00)
LYMPH#: 1.6 10*3/uL (ref 0.9–3.3)
LYMPH%: 48.8 % (ref 14.0–49.7)
MCH: 28.7 pg (ref 25.1–34.0)
MCHC: 31.7 g/dL (ref 31.5–36.0)
MCV: 90.6 fL (ref 79.5–101.0)
MONO#: 0.2 10*3/uL (ref 0.1–0.9)
MONO%: 6.8 % (ref 0.0–14.0)
NEUT#: 1.4 10*3/uL — ABNORMAL LOW (ref 1.5–6.5)
NEUT%: 42.1 % (ref 38.4–76.8)
Platelets: 182 10*3/uL (ref 145–400)
RBC: 4.18 10*6/uL (ref 3.70–5.45)
RDW: 28.9 % — ABNORMAL HIGH (ref 11.2–14.5)
RETIC %: 1.35 % (ref 0.70–2.10)
Retic Ct Abs: 56.43 10*3/uL (ref 33.70–90.70)
WBC: 3.3 10*3/uL — ABNORMAL LOW (ref 3.9–10.3)

## 2013-11-15 LAB — TECHNOLOGIST REVIEW

## 2013-11-15 LAB — FERRITIN CHCC: FERRITIN: 207 ng/mL (ref 9–269)

## 2013-11-15 NOTE — Progress Notes (Signed)
Right lower extremity venous duplex completed.  Right:  No evidence of DVT, superficial thrombosis, or Baker's cyst.  Left:  Negative for DVT in the common femoral vein.  

## 2013-11-15 NOTE — Assessment & Plan Note (Signed)
I am very concerned about undiagnosed right lower extremity blood clot. I placed an urgent order to exclude DVT. Surprisingly, venous Doppler ultrasound does not show signs to suggest blood clots. I recommend checking CT scan of the abdomen and pelvis to exclude pelvic pathology as a cause of unilateral right lower extremity edema and she agreed to proceed.

## 2013-11-15 NOTE — Telephone Encounter (Signed)
OK 

## 2013-11-15 NOTE — Progress Notes (Signed)
[facility] OFFICE PROGRESS NOTE  [name],Rebecca D, MD SUMMARY OF HEMATOLOGIC HISTORY: She was found to have abnormal CBC from routine blood work. On review of her CBC from 2011, she had been anemic with hemoglobin as low as 8.7. At that time, she was pregnant with her last child with history of pregnancy associated thrombocytopenia as well but never saw a hematologist. She complained of heavy menstruation of bleeding 7 days with irregular cycles. She was told she has uterine fibroids. She has 4 pregnancies and never received blood transfusion. Recently, she presented to the ER in August 2015 with significant fatigue and severe anemia and declined blood transfusion. She was given 510 milligrams of intravenous iron ferritin and tolerated that well. On 10/24/2013, she received another iron infusion. INTERVAL HISTORY: [name] 38 y.o. female returns for further followup. Her fatigue has improved. She is forgetful and managed only take on average 4 iron tablets per week since I saw her. She is concerned about new onset of unilateral right lower extremity edema. Ultrasound venous Doppler in August 2050 did not reveal any blood clots. She denies trauma to her leg.  I have reviewed the past medical history, past surgical history, social history and family history with the patient and they are unchanged from previous note.  ALLERGIES:  is allergic to shrimp and sucralose.  MEDICATIONS:  Current Outpatient Prescriptions  Medication Sig Dispense Refill  . acyclovir (ZOVIRAX) 400 MG tablet Three times daily x 7 days if outbreak  28 tablet  11  . ibuprofen (ADVIL,MOTRIN) 200 MG tablet Take 200 mg by mouth once as needed for cramping.      . Iron Combinations (IRON COMPLEX PO) Take by mouth.      . Multiple Vitamin (MULTIVITAMIN WITH MINERALS) TABS tablet Take 1 tablet by mouth daily.       No current facility-administered medications for this visit.     REVIEW OF SYSTEMS:    Constitutional: Denies fevers, chills or night sweats Eyes: Denies blurriness of vision Ears, nose, mouth, throat, and face: Denies mucositis or sore throat Respiratory: Denies cough, dyspnea or wheezes Cardiovascular: Denies palpitation, chest discomfort  Gastrointestinal:  Denies nausea, heartburn or change in bowel habits Skin: Denies abnormal skin rashes Lymphatics: Denies new lymphadenopathy or easy bruising Neurological:Denies numbness, tingling or new weaknesses Behavioral/Psych: Mood is stable, no new changes  All other systems were reviewed with the patient and are negative.  PHYSICAL EXAMINATION: ECOG PERFORMANCE STATUS: 0 - Asymptomatic  Filed Vitals:   11/15/13 1321  BP: 150/91  Pulse: 55  Temp: 98.4 F (36.9 C)  Resp: 18   Filed Weights   11/15/13 1321  Weight: 156 lb 8 oz (70.988 kg)    GENERAL:alert, no distress and comfortable SKIN: skin color, texture, turgor are normal, no rashes or significant lesions EYES: normal, Conjunctiva are pink and non-injected, sclera clear OROPHARYNX:no exudate, no erythema and lips, buccal mucosa, and tongue normal  NECK: supple, thyroid normal size, non-tender, without nodularity LYMPH:  no palpable lymphadenopathy in the cervical, axillary or inguinal LUNGS: clear to auscultation and percussion with normal breathing effort HEART: regular rate & rhythm and no murmurs. She has unilateral right lower extremity edema ABDOMEN:abdomen soft, non-tender and normal bowel sounds Musculoskeletal:no cyanosis of digits and no clubbing  NEURO: alert & oriented x 3 with fluent speech, no focal motor/sensory deficits  LABORATORY DATA:  I have reviewed the data as listed Results for orders placed in visit on 11/15/13 (from the past 48  hour(s))  CBC & DIFF AND RETIC     Status: Abnormal   Collection Time    11/15/13  1:08 PM      Result Value Ref Range   WBC 3.3 (*) 3.9 - 10.3 10e3/uL   NEUT# 1.4 (*) 1.5 - 6.5 10e3/uL   HGB 12.0  11.6  - 15.9 g/dL   HCT 37.9  34.8 - 46.6 %   Platelets 182  145 - 400 10e3/uL   MCV 90.6  79.5 - 101.0 fL   MCH 28.7  25.1 - 34.0 pg   MCHC 31.7  31.5 - 36.0 g/dL   RBC 4.18  3.70 - 5.45 10e6/uL   RDW 28.9 (*) 11.2 - 14.5 %   lymph# 1.6  0.9 - 3.3 10e3/uL   MONO# 0.2  0.1 - 0.9 10e3/uL   Eosinophils Absolute 0.1  0.0 - 0.5 10e3/uL   Basophils Absolute 0.0  0.0 - 0.1 10e3/uL   NEUT% 42.1  38.4 - 76.8 %   LYMPH% 48.8  14.0 - 49.7 %   MONO% 6.8  0.0 - 14.0 %   EOS% 1.5  0.0 - 7.0 %   BASO% 0.8  0.0 - 2.0 %   Retic % 1.35  0.70 - 2.10 %   Retic Ct Abs 56.43  33.70 - 90.70 10e3/uL   Immature Retic Fract 2.00  1.60 - 10.00 %  FERRITIN [facility]     Status: None   Collection Time    11/15/13  1:08 PM      Result Value Ref Range   Ferritin 207  9 - 269 ng/ml  TECHNOLOGIST REVIEW     Status: None   Collection Time    11/15/13  1:08 PM      Result Value Ref Range   Technologist Review mod ovalos, rare helmets and acanthocytes      Lab Results  Component Value Date   WBC 3.3* 11/15/2013   HGB 12.0 11/15/2013   HCT 37.9 11/15/2013   MCV 90.6 11/15/2013   PLT 182 11/15/2013    ASSESSMENT & PLAN:  Iron deficiency anemia The most likely cause of her anemia is due to chronic blood loss from menorrhagia. Since intravenous iron infusion, anemia has resolved.  Leg edema, right I am very concerned about undiagnosed right lower extremity blood clot. I placed an urgent order to exclude DVT. Surprisingly, venous Doppler ultrasound does not show signs to suggest blood clots. I recommend checking CT scan of the abdomen and pelvis to exclude pelvic pathology as a cause of unilateral right lower extremity edema and she agreed to proceed.    All questions were answered. The patient knows to call the clinic with any problems, questions or concerns. No barriers to learning was detected.  I spent 25 minutes counseling the patient face to face. The total time spent in the appointment was 30 minutes and more  than 50% was on counseling.     [facility], Haidy Kackley, MD 11/15/2013 8:37 PM

## 2013-11-15 NOTE — Assessment & Plan Note (Signed)
The most likely cause of her anemia is due to chronic blood loss from menorrhagia. Since intravenous iron infusion, anemia has resolved.

## 2013-11-15 NOTE — Telephone Encounter (Signed)
Call from [place] at [facility] states pt Negative for both DVT and Baker's cyst on right lower extremity.   They let pt go.

## 2013-11-16 ENCOUNTER — Telehealth: Payer: Self-pay | Admitting: *Deleted

## 2013-11-16 NOTE — Telephone Encounter (Signed)
Message copied by Cathlean Cower on Wed Nov 16, 2013  8:36 AM ------      Message from: [facility], [place]      Created: Tue Nov 15, 2013  8:33 PM      Regarding: CT       Per discussion today, if [place] is negative I plan to order CT to look for reason why her leg is swollen. I placed order. Please let her know her iron studies are great. Ferritin >100. ------

## 2013-11-16 NOTE — Telephone Encounter (Signed)
Informed pt of CT ordered and to expect call from Radiology to schedule.  Also informed her of Ferritin good.  She verbalized understanding.

## 2013-11-17 ENCOUNTER — Telehealth: Payer: Self-pay | Admitting: Hematology and Oncology

## 2013-11-17 NOTE — Telephone Encounter (Signed)
s.w. pt and advised on all appts...mailed pt appt sched/avs adn letter

## 2013-11-18 ENCOUNTER — Other Ambulatory Visit: Payer: Self-pay | Admitting: Hematology and Oncology

## 2013-11-18 ENCOUNTER — Telehealth: Payer: Self-pay | Admitting: *Deleted

## 2013-11-18 DIAGNOSIS — R600 Localized edema: Secondary | ICD-10-CM

## 2013-11-18 NOTE — Telephone Encounter (Signed)
Informed pt of [place] due to Insurance denied CT. She verbalized understanding and has appt information for [place].

## 2013-11-18 NOTE — Telephone Encounter (Signed)
Message copied by Cathlean Cower on Fri Nov 18, 2013 12:10 PM ------      Message from: [facility], [place]: Fri Nov 18, 2013 10:52 AM      Regarding: [name]       I spoke with her insurance and they declined Ct, recommended we start with [name] first which I have reodered for next week at [facility]. Vaughan Basta will cancel the CT and resubmit [name]. PLease let the patient know ------

## 2013-11-22 ENCOUNTER — Other Ambulatory Visit: Payer: Managed Care, Other (non HMO)

## 2013-11-22 ENCOUNTER — Telehealth: Payer: Self-pay | Admitting: *Deleted

## 2013-11-22 ENCOUNTER — Ambulatory Visit (HOSPITAL_COMMUNITY)
Admission: RE | Admit: 2013-11-22 | Discharge: 2013-11-22 | Disposition: A | Discharge status: Home / Self Care | Payer: Managed Care, Other (non HMO) | Source: Ambulatory Visit | Attending: Hematology and Oncology | Admitting: Hematology and Oncology

## 2013-11-22 DIAGNOSIS — R600 Localized edema: Secondary | ICD-10-CM | POA: Insufficient documentation

## 2013-11-22 DIAGNOSIS — K7689 Other specified diseases of liver: Secondary | ICD-10-CM | POA: Diagnosis not present

## 2013-11-22 NOTE — Telephone Encounter (Signed)
Pt notified of results below. Will send referral to vascular center

## 2013-11-22 NOTE — Telephone Encounter (Signed)
Message copied by Patton Salles on Tue Nov 22, 2013 12:13 PM ------      Message from: [facility], [place]: Tue Nov 22, 2013 11:57 AM      Regarding: [place]       [place] is nromal, cannot explain why her leg is swollen. Recommend referral to vascular center for eval. If Ok with her, please send in a referral      ----- Message -----         From: Rad Results In Interface         Sent: 11/22/2013  10:16 AM           To: Heath Lark, MD                   ------

## 2013-11-23 ENCOUNTER — Other Ambulatory Visit: Payer: Self-pay | Admitting: *Deleted

## 2013-11-23 ENCOUNTER — Telehealth: Payer: Self-pay | Admitting: Hematology and Oncology

## 2013-11-25 ENCOUNTER — Other Ambulatory Visit: Payer: Self-pay | Admitting: *Deleted

## 2013-11-25 ENCOUNTER — Telehealth: Payer: Self-pay | Admitting: Hematology and Oncology

## 2013-11-25 ENCOUNTER — Other Ambulatory Visit: Payer: Self-pay | Admitting: Hematology and Oncology

## 2013-11-25 ENCOUNTER — Other Ambulatory Visit: Payer: Managed Care, Other (non HMO)

## 2013-11-25 DIAGNOSIS — R600 Localized edema: Secondary | ICD-10-CM

## 2013-11-28 ENCOUNTER — Ambulatory Visit: Payer: Managed Care, Other (non HMO) | Admitting: Hematology and Oncology

## 2013-12-01 ENCOUNTER — Other Ambulatory Visit: Payer: Self-pay

## 2013-12-01 DIAGNOSIS — R600 Localized edema: Secondary | ICD-10-CM

## 2013-12-12 ENCOUNTER — Encounter: Payer: Self-pay | Admitting: Hematology and Oncology

## 2013-12-14 ENCOUNTER — Encounter: Payer: Self-pay | Admitting: Vascular Surgery

## 2013-12-15 ENCOUNTER — Encounter: Payer: Self-pay | Admitting: Vascular Surgery

## 2013-12-15 ENCOUNTER — Ambulatory Visit (HOSPITAL_COMMUNITY)
Admission: RE | Admit: 2013-12-15 | Discharge: 2013-12-15 | Disposition: A | Discharge status: Home / Self Care | Payer: Managed Care, Other (non HMO) | Source: Ambulatory Visit | Attending: Vascular Surgery | Admitting: Vascular Surgery

## 2013-12-15 ENCOUNTER — Ambulatory Visit (INDEPENDENT_AMBULATORY_CARE_PROVIDER_SITE_OTHER): Payer: Managed Care, Other (non HMO) | Admitting: Vascular Surgery

## 2013-12-15 VITALS — BP 134/91 | HR 66 | Temp 97.5°F | Resp 14 | Ht 64.5 in | Wt 157.0 lb

## 2013-12-15 DIAGNOSIS — R600 Localized edema: Secondary | ICD-10-CM | POA: Diagnosis present

## 2013-12-15 DIAGNOSIS — M7989 Other specified soft tissue disorders: Secondary | ICD-10-CM

## 2013-12-15 NOTE — Progress Notes (Addendum)
[facility] [place] HISTORY AND PHYSICAL   History of Present Illness:  Patient is a 38 y.o. year old female who presents for evaluation of right leg swelling. tthis is been present for about 2 months on the right side. Occasionally the left leg swells. The left side is primarily in the foot. There did not seem to be any precipitating factors. She has no history of DVT. Other medical problems include chronic anemia which is followed by her hematologist,hypertension and ocular migraines, depression all of which are currently stable.  Past Medical History  Diagnosis Date  . GERD (gastroesophageal reflux disease)   . Complication of anesthesia     BAck pain after epidural 2007  . Heart palpitations     FEW X PER WEEK  . Depression 2000    MEDS IN PAST  . Abnormal Pap smear     COLPO;   LEEP 2003  . Infection     CHLAMYDIA?  Marland Kitchen Infection     HERPES  . Infection     TRICH  . Infection     YEAST; FREQUENT WITH NUIVAring  . Infection     UTI OCC  . High blood pressure 2012    ON MED  . Anemia HX  IN TEENS    ON FE  . Fibroid   . IBS (irritable bowel syndrome)   . Head injury AS TEEN  . 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  . Migraines     OCULAR MIGRAINES;  HAS HX LOSS FO VISION  . Leg edema, right 11/15/2013    Past Surgical History  Procedure Laterality Date  . Laparoscopy    . Tonsillectomy  2002  . Wisdom tooth extraction  2005  . Induced abortion  1999    Social History History  Substance Use Topics  . Smoking status: Former Smoker    Types: Cigarettes    Quit date: 08/30/2010  . Smokeless tobacco: Never Used  . Alcohol Use: Yes     Comment: OCC    Family History Family History  Problem Relation Age of Onset  . Migraines Mother   . Heart disease Father     MI  . Mental illness Father   . Hypertension Maternal Aunt   . Cancer Maternal Aunt     breast  . Hypertension Maternal Uncle   . Mental  illness Paternal Aunt   . Mental illness Paternal Uncle   . Heart disease Paternal Uncle   . Thyroid disease Maternal Grandmother   . Kidney disease Maternal Grandmother     DIALYSIS  . Hypertension Maternal Grandmother   . Arthritis Maternal Grandmother   . Cancer Maternal Grandmother   . Hypertension Maternal Grandfather   . Diabetes Maternal Grandfather   . Arthritis Maternal Grandfather   . Alcohol abuse Maternal Grandfather   . Stroke Maternal Grandfather   . Mental illness Paternal Grandfather   . Cancer Cousin     breast  . Asthma Cousin   . Mental illness Cousin     Allergies  Allergies  Allergen Reactions  . Shrimp [Shellfish Allergy] Other (See Comments)    Headache when pt eat too much  . Sucralose Nausea Only     Current Outpatient Prescriptions  Medication Sig Dispense Refill  . acyclovir (ZOVIRAX) 400 MG tablet Three times daily x 7 days if outbreak 28 tablet 11  . ibuprofen (ADVIL,MOTRIN) 200 MG tablet Take 200 mg  by mouth once as needed for cramping.    . Iron Combinations (IRON COMPLEX PO) Take by mouth.    . Multiple Vitamin (MULTIVITAMIN WITH MINERALS) TABS tablet Take 1 tablet by mouth daily.     No current facility-administered medications for this visit.    ROS:   General:  No weight loss, Fever, chills  HEENT: No recent headaches, no nasal bleeding, no visual changes, no sore throat  Neurologic: No dizziness, blackouts, seizures. No recent symptoms of stroke or mini- stroke. No recent episodes of slurred speech, or temporary blindness.  Cardiac: No recent episodes of chest pain/pressure, no shortness of breath at rest.  +shortness of breath with exertion.  Denies history of atrial fibrillation or irregular heartbeat  Vascular: No history of rest pain in feet.  No history of claudication.  No history of non-healing ulcer, No history of DVT   Pulmonary: No home oxygen, no productive cough, no hemoptysis,  No asthma or  wheezing  Musculoskeletal:  [ ]  Arthritis, [ ]  Low back pain,  [ ]  Joint pain  Hematologic:No history of hypercoagulable state.  No history of easy bleeding.  + history of anemia  Gastrointestinal: No hematochezia or melena,  No gastroesophageal reflux, no trouble swallowing  Urinary: [ ]  chronic Kidney disease, [ ]  on HD - [ ]  MWF or [ ]  TTHS, [ ]  Burning with urination, [ ]  Frequent urination, [ ]  Difficulty urinating;   Skin: No rashes  Psychological: No history of anxiety,  + history of depression   Physical Examination  Filed Vitals:   12/15/13 1106  BP: 134/91  Pulse: 66  Temp: 97.5 F (36.4 C)  TempSrc: Oral  Resp: 14  Height: 5' 4.5" (1.638 m)  Weight: 157 lb (71.215 kg)  SpO2: 100%    Body mass index is 26.54 kg/(m^2).  General:  Alert and oriented, no acute distress HEENT: Normal Neck: No bruit or JVD Pulmonary: Clear to auscultation bilaterally Cardiac: Regular Rate and Rhythm without murmur Abdomen: Soft, non-tender, non-distended, no mass Skin: No rash Extremity Pulses:  2+ radial, brachial, femoral, dorsalis pedis, posterior tibial pulses bilaterally Musculoskeletal: No deformity trace edema right leg greater than left  Neurologic: Upper and lower extremity motor 5/5 and symmetric  DATA:  Patient had a venous reflux exam today which I reviewed and interpreted. This showed some mild reflux in her right common femoral and popliteal vein. There is no DVT. The superficial venous system had no evidence of reflux.  ASSESSMENT:  Right leg swelling. She does have a component of deep vein reflux. There are really no good interventions for deep vein reflux. The mainstay of therapy is compression. She may also has some component of this on the left side. Superficial venous system via the greater saphenous vein is intact so she would not benefit from laser ablation.   PLAN:  Patient was given a prescription today for bilateral lower extremity compression stockings  20-30 mm. She should get symptomatic relief from this. If she has continued worsening of leg swelling symptoms. Other avenues and differential diagnosis may need to be pursued as overall her venous reflux exam was fairly underwhelming. She has also been scheduled for a CT scan of the abdomen and pelvis by her hematologist to rule out a lymphatic obstruction source or occult malignancy. I agree with this as well.  Ruta Hinds, MD [facility] [place] Office: [phone] Pager: [phone]

## 2013-12-21 ENCOUNTER — Other Ambulatory Visit: Payer: Self-pay | Admitting: Hematology and Oncology

## 2014-02-15 ENCOUNTER — Other Ambulatory Visit: Payer: Managed Care, Other (non HMO)

## 2014-02-23 ENCOUNTER — Ambulatory Visit: Payer: Managed Care, Other (non HMO) | Admitting: Hematology and Oncology

## 2014-12-20 NOTE — Nursing Note (Signed)
Nursing Discharge Summary - Text       Nursing Discharge Summary Entered On:  12/20/2014 17:54 EST    Performed On:  12/20/2014 17:53 EST by Daphine DeutscherMARTIN, RN, Apolinar JunesBRANDON               DC Information   Mode of Discharge :   Ambulatory   Transportation :   Private vehicle   [place], [place], [place]ANDON - 12/20/2014 17:53 EST   Education   Responsible Learner(s) :   No Data Available     Teaching Method :   Explanation, Printed materials   Roylene ReasonMARTIN, RN, BRANDON - 12/20/2014 17:53 EST   Medication Education Adult Grid   Med Dosage, Route, Scheduling :   Verbalizes understanding   Daphine DeutscherMARTIN, RN, Apolinar JunesBRANDON - 12/20/2014 17:53 EST

## 2014-12-26 NOTE — Care Plan (Signed)
Ambulatory Patient Education     Patient Education Materials Follows:

## 2014-12-26 NOTE — Oncology Nurse Navigation (Signed)
Oncology Patient Summary                    [facility]  [address]  [place], [place] [address]  [phone]        Person Information:  Name: Jennifer Dickerson, Jennifer Dickerson  Birthdate: Aug 05, 1975  Home Address[place] [address] [place] [id number]        Any confirmed future appointments are below:               [facility]   INF IV Infusion 30 Min [place] Inf Ther 12/26/2014 08:00:00 12/26/2014 08:30:00 Confirmed          Nursing Comments:

## 2014-12-28 NOTE — Oncology Nurse Navigation (Signed)
Oncology Patient Summary                    [facility]  [address]  [place], [place] [id number]  [phone]        Person Information:  Name: Leanne ChangHOLMES, Lynnette  Birthdate: 24-Sep-1975  Home Address: [address] [place] [id number]        Any confirmed future appointments are below:               [facility]   INF IV Infusion 4 Hr SF Inf Ther 12/28/2014 14:00:00 12/28/2014 18:00:00 Confirmed          Nursing Comments:

## 2014-12-28 NOTE — Care Plan (Signed)
Ambulatory Patient Education     Patient Education Materials Follows:

## 2015-01-03 NOTE — Care Plan (Signed)
Ambulatory Patient Education     Patient Education Materials Follows:

## 2015-01-03 NOTE — Oncology Nurse Navigation (Signed)
Oncology Patient Summary                    [facility]  [address]  [place], [place] [id number]  [phone]        Person Information:  Name: Jennifer ChangHOLMES, Lummie  Birthdate: 1975/02/28  Home Address: [address] [place] [id number]        Any confirmed future appointments are below:               [facility]   INF IV Infusion 1 Hr SF Inf Ther 01/03/2015 16:00:00 01/03/2015 17:00:00 Confirmed          Nursing Comments:

## 2015-11-10 IMAGING — CR DG CHEST 2V
2 series · 2 of 2 positions shown · non-contrast
Comparison: None.

CLINICAL DATA: Chest pain and hypertension

EXAM:
CHEST  2 VIEW

[w chest pa]
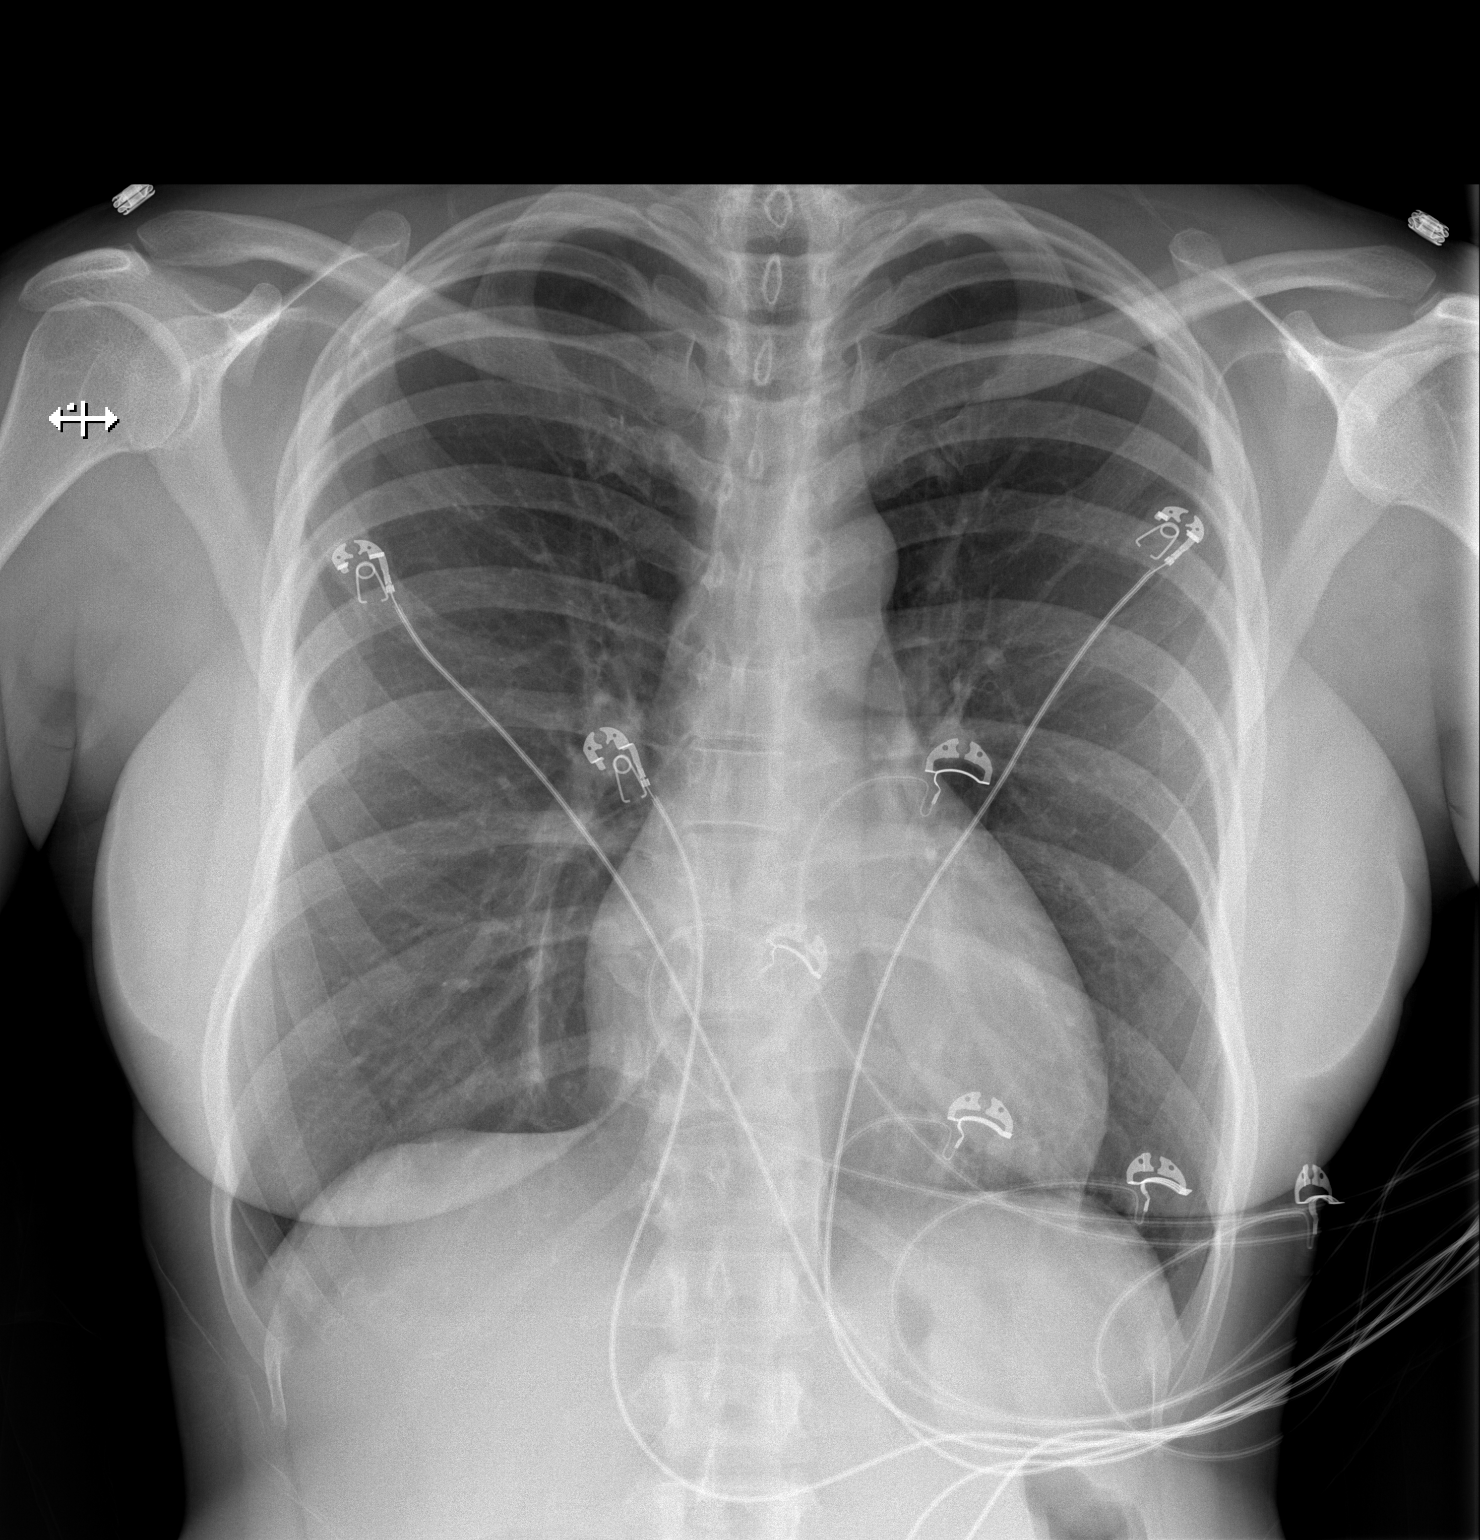

[w chest lat]
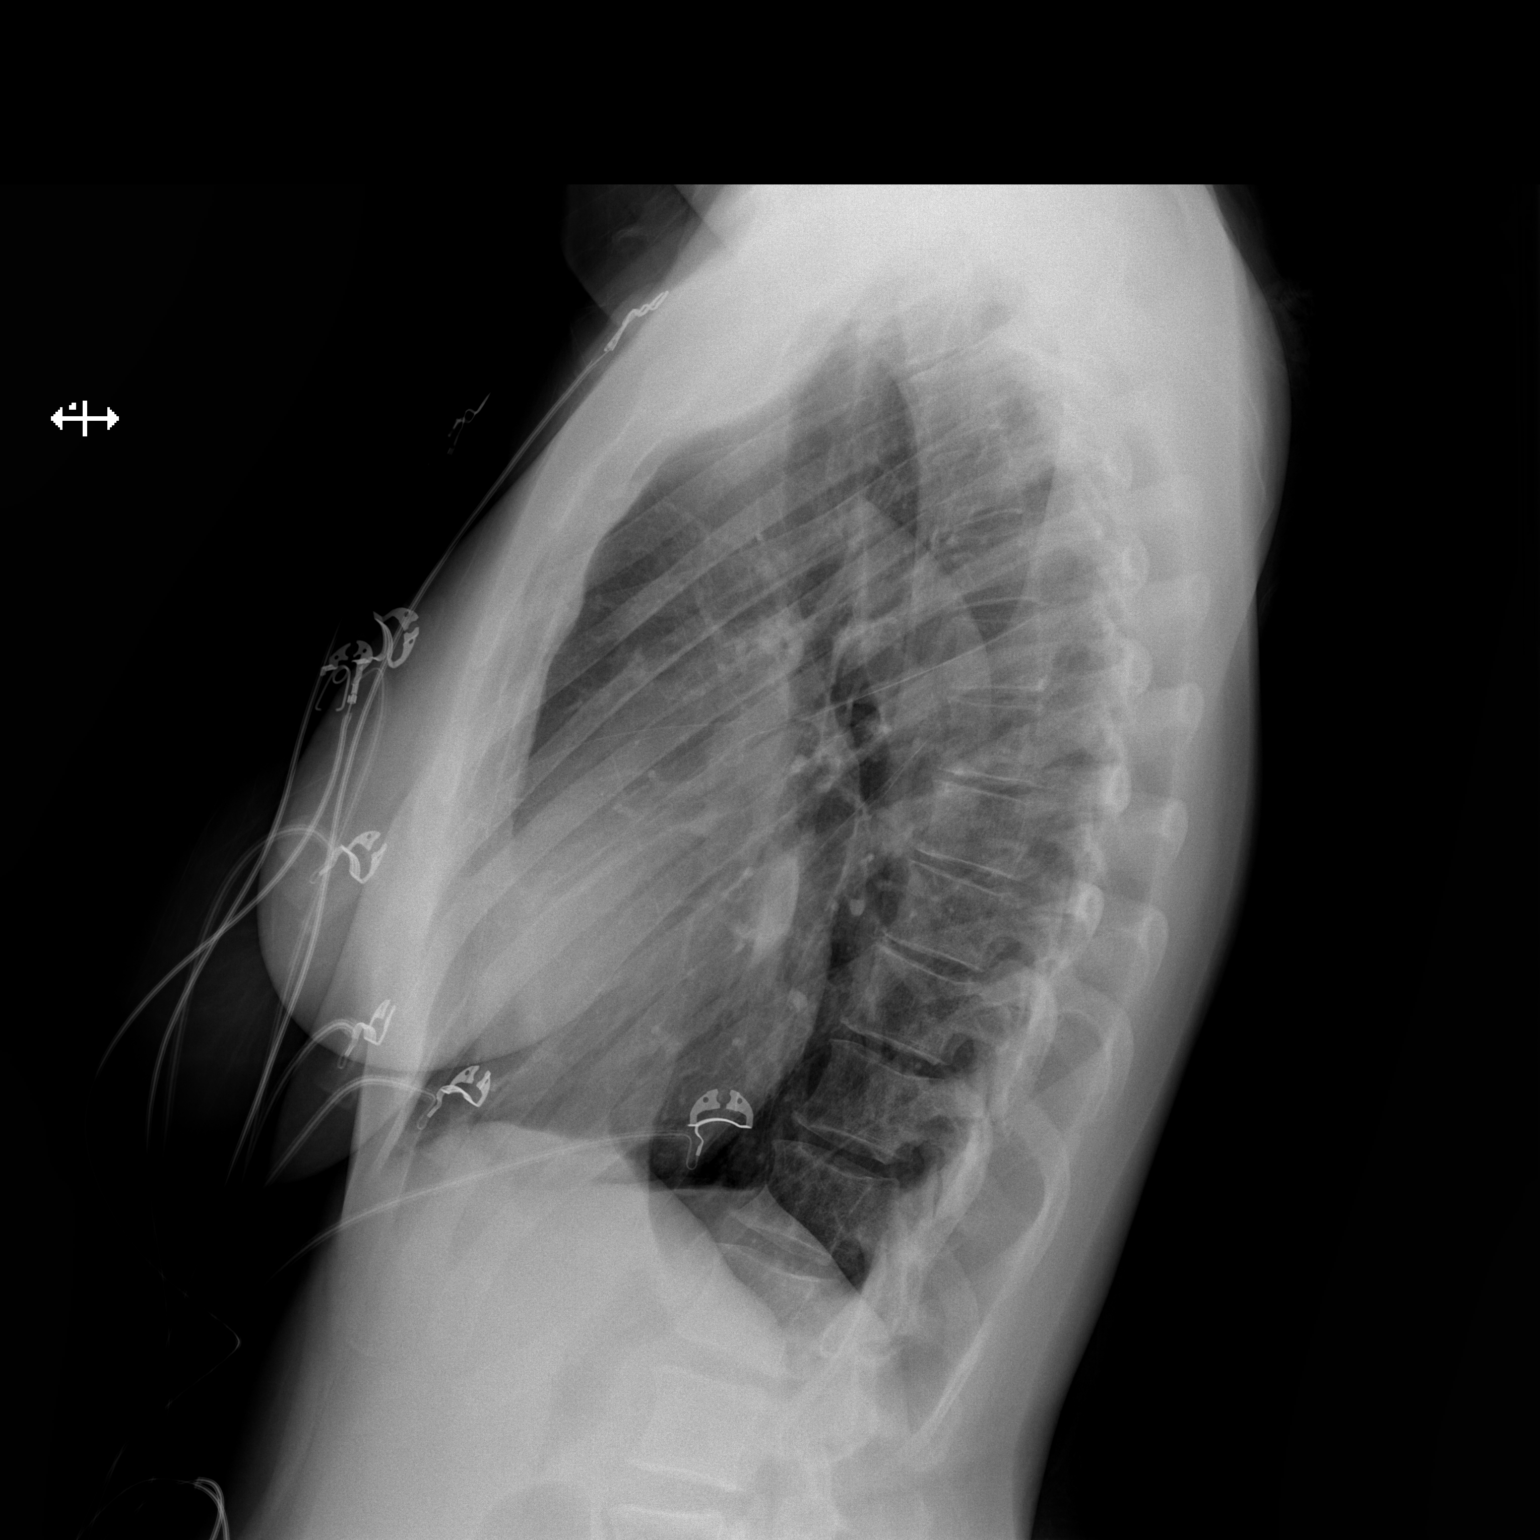

[2 of 2 positions shown; findings below may reference images not displayed]

FINDINGS: Lungs are clear. Heart size and pulmonary vascularity are normal. No
adenopathy. No pneumothorax. No bone lesions.
IMPRESSION: No abnormality noted.

## 2015-12-29 IMAGING — US US ABDOMEN COMPLETE
1 series · 14 of 25 positions shown · non-contrast
Comparison: None.

CLINICAL DATA: Right leg edema

EXAM:
ULTRASOUND ABDOMEN COMPLETE

[Series 1: us abdomen complete · 0.17mm/px · 14 of 86 slices shown]
[im 1/86]
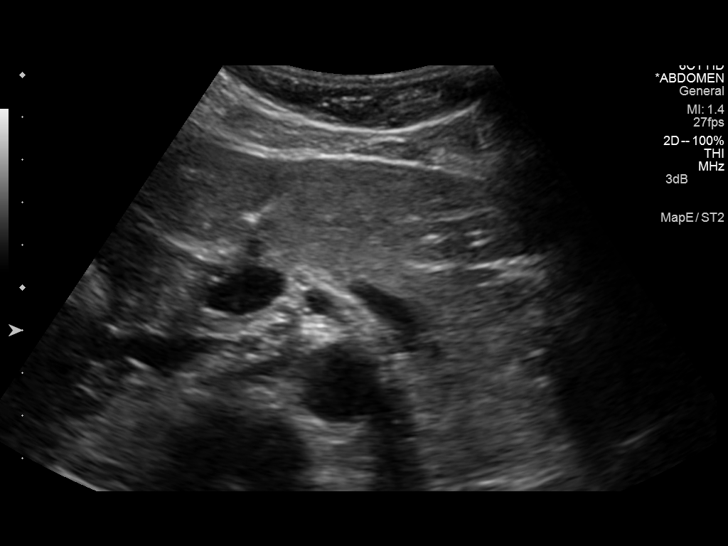
[im 8/86]
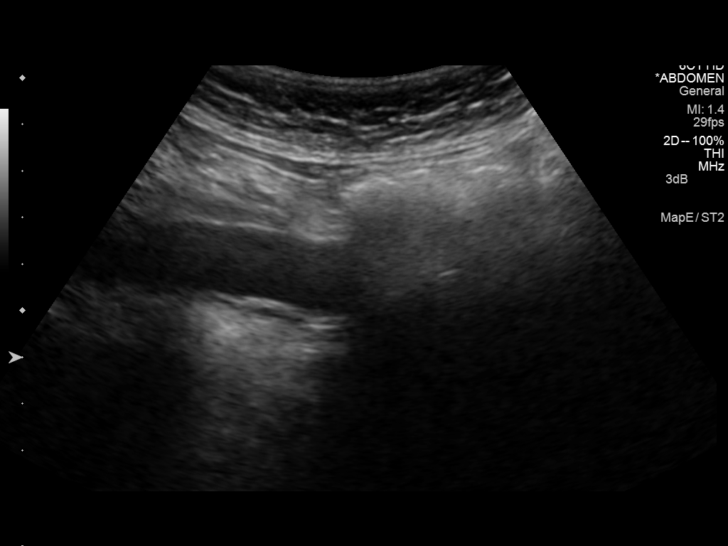
[im 15/86]
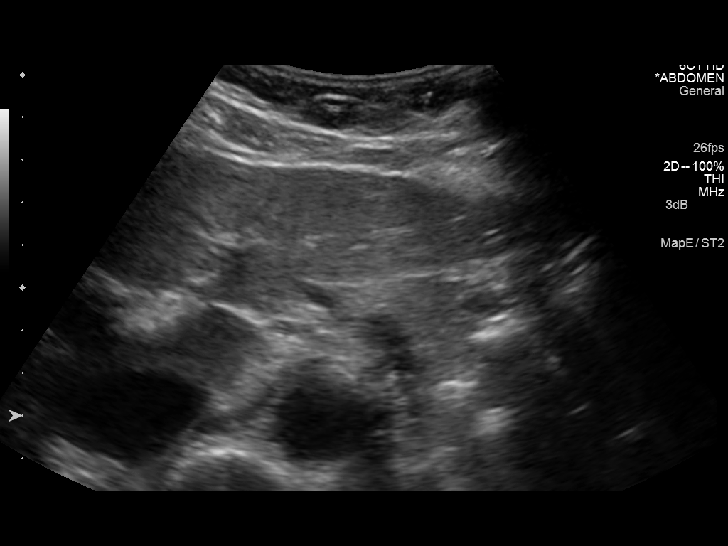
[im 22/86]
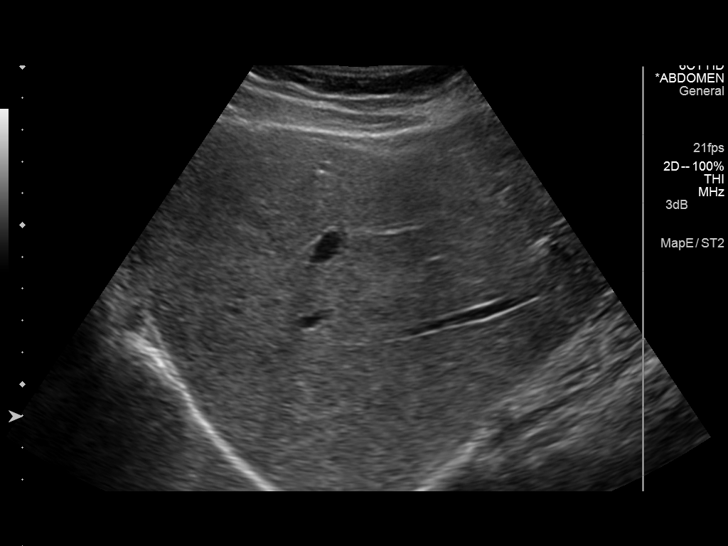
[im 29/86]
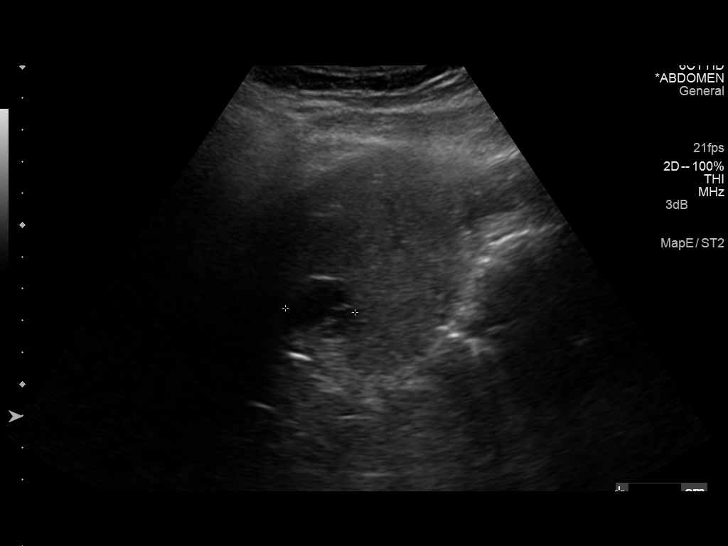
[im 32/86]
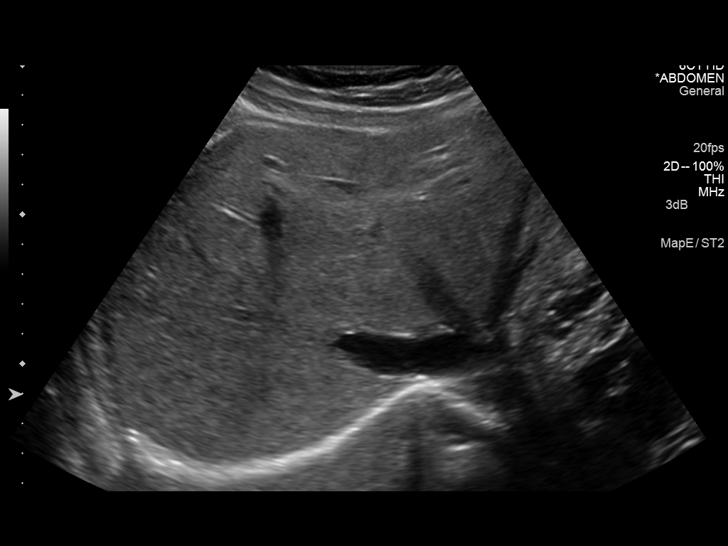
[im 39/86]
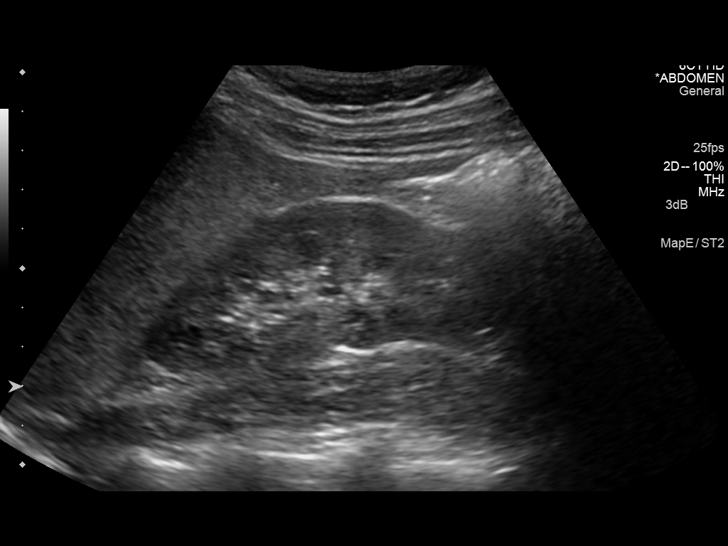
[im 47/86]
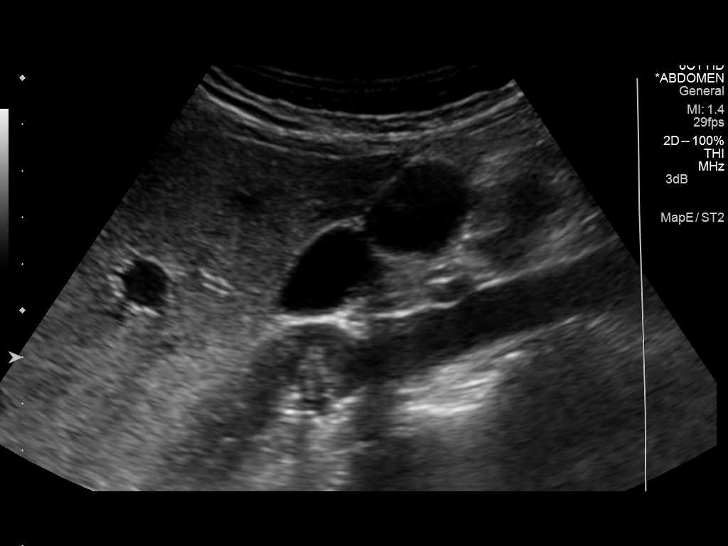
[im 54/86]
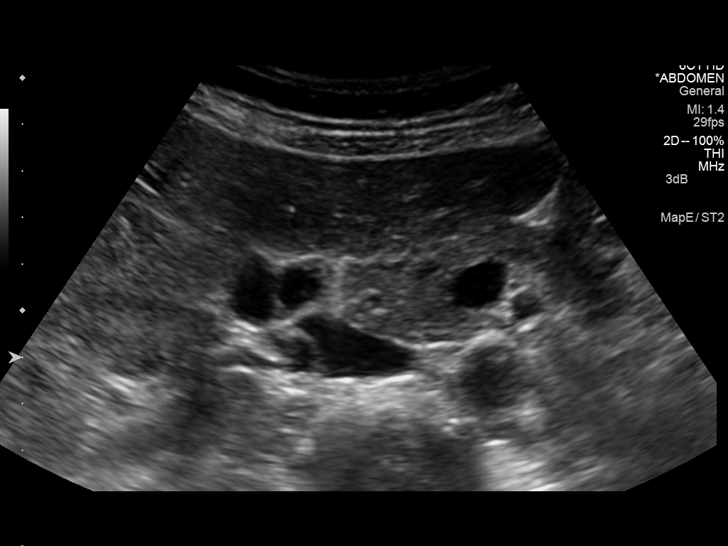
[im 57/86]
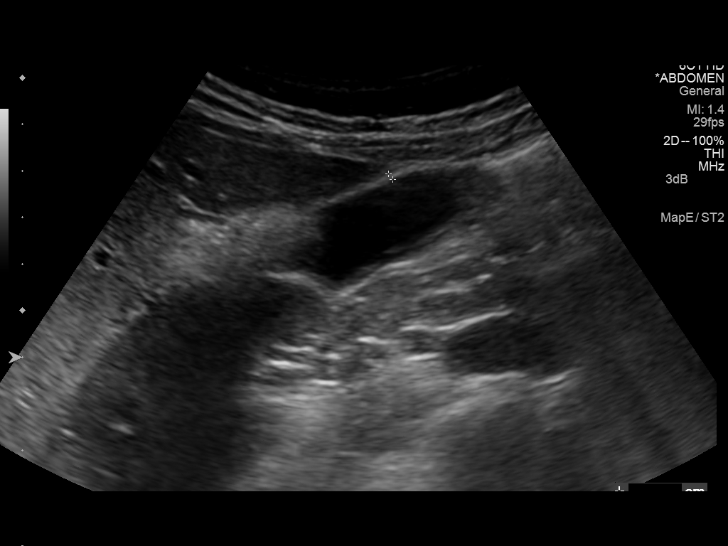
[im 64/86]
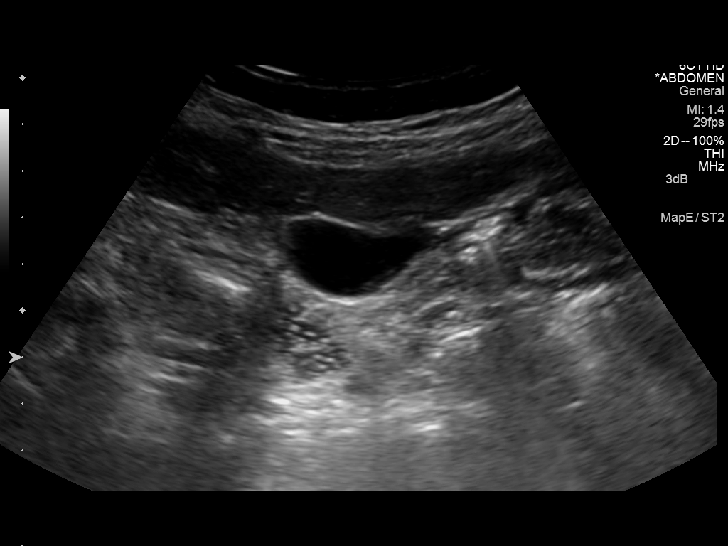
[im 71/86]
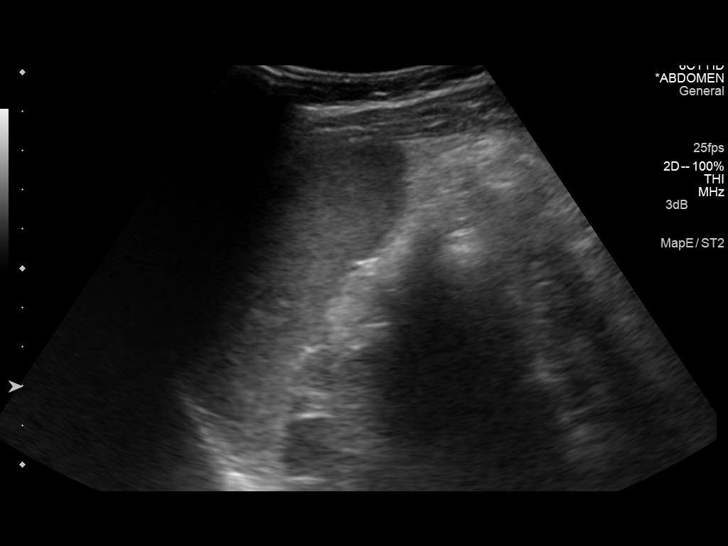
[im 78/86]
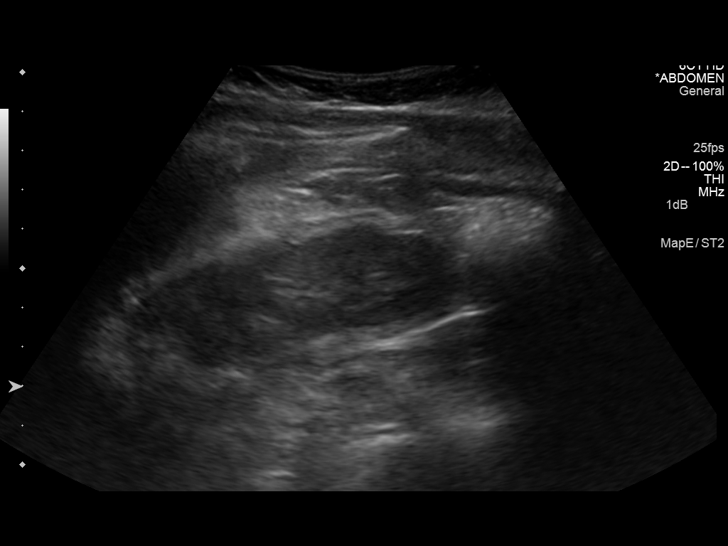
[im 86/86]
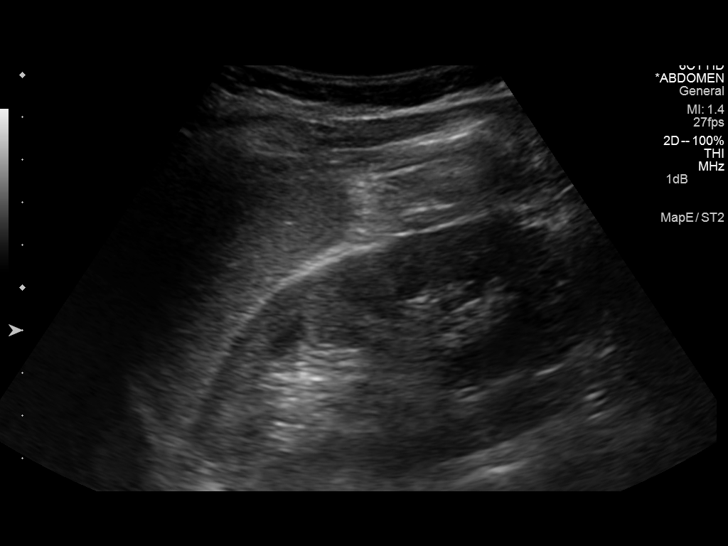

[14 of 25 positions shown; findings below may reference images not displayed]

FINDINGS: Gallbladder: No gallstones or wall thickening visualized. No
sonographic Murphy sign noted.

Common bile duct: Diameter: 3.4 mm

Liver: 2.4 x 2 x 2.2 cm anechoic right hepatic mass most consistent
with a cyst. Within normal limits in parenchymal echogenicity.

IVC: No abnormality visualized.

Pancreas: Visualized portion unremarkable.

Spleen: Size and appearance within normal limits.

Right Kidney: Length: 9.7 cm. Echogenicity within normal limits. No
mass or hydronephrosis visualized.

Left Kidney: Length: 10.6 cm. Echogenicity within normal limits. No
mass or hydronephrosis visualized.

Abdominal aorta: No aneurysm visualized.

Other findings: None.
IMPRESSION: 1. Right hepatic cyst.
2. Otherwise unremarkable abdominal ultrasound.

## 2018-01-15 LAB — IRON AND TIBC
Iron Saturation: 68 % — ABNORMAL HIGH (ref 20–40)
Iron: 263 ug/dL — ABNORMAL HIGH (ref 37–145)
TIBC: 384 ug/dL (ref 250–450)
UIBC: 121 ug/dL (ref 112.0–347.0)

## 2018-01-15 LAB — SOLUBLE TRANSFERRIN RECEPTOR: Soluble Transferrin Recept: 22.4 nmol/L (ref 12.2–27.3)

## 2018-01-15 LAB — RETICULOCYTES
RBC: 3.5 x10e6/mcL — ABNORMAL LOW (ref 3.60–5.20)
Retic Ct Abs: 0.0707 /mcL (ref 0.0210–0.1080)
Retic Ct Pct: 2 % (ref 0.5–2.0)

## 2018-01-15 LAB — VITAMIN B12: Vitamin B-12: 1190 pg/mL (ref 232–1245)

## 2018-01-15 LAB — FERRITIN: Ferritin: 30.4 ng/mL (ref 13.0–150.0)

## 2018-01-15 LAB — FOLATE: Folate: >20 ng/mL (ref 4.80–24.20)

## 2018-01-18 LAB — METHYLMALONIC ACID, SERUM: Methylmalonic Acid: 129 nmol/L (ref 0–378)

## 2018-05-19 LAB — IRON AND TIBC
Iron Saturation: 4 % — ABNORMAL LOW (ref 20–40)
Iron: 16 ug/dL — ABNORMAL LOW (ref 37–145)
TIBC: 429 ug/dL (ref 250–450)
UIBC: 413 ug/dL — ABNORMAL HIGH (ref 112.0–347.0)

## 2018-05-19 LAB — FERRITIN: Ferritin: 3.8 ng/mL — ABNORMAL LOW (ref 13.0–150.0)

## 2018-05-19 LAB — RETICULOCYTES
RBC: 2.93 x10e6/mcL — ABNORMAL LOW (ref 3.60–5.20)
Retic Ct Abs: 0.0372 /mcL (ref 0.0210–0.1080)
Retic Ct Pct: 1.3 % (ref 0.5–2.0)

## 2018-05-19 LAB — VITAMIN B12: Vitamin B-12: 763 pg/mL (ref 232–1245)

## 2018-05-19 LAB — FOLATE: Folate: 18.06 ng/mL (ref 4.80–24.20)

## 2018-05-20 LAB — SOLUBLE TRANSFERRIN RECEPTOR: Soluble Transferrin Recept: 52.1 nmol/L — ABNORMAL HIGH (ref 12.2–27.3)

## 2018-05-25 NOTE — Care Plan (Signed)
Ambulatory Patient Education     Patient Education Materials Follows:

## 2018-05-25 NOTE — Oncology Nurse Navigation (Signed)
Oncology Patient Summary                     [facility]  [address]  [place], [place] [address]  [phone]        Person Information:  Name: Jennifer Dickerson, KAROW  Birthdate: [birth year]  Home Address: [address] [place] [address]      Any confirmed future appointments are below:                 Type Location Start [facility]   INF Oncology - Special Accommodation Sch SF Inf Ther Apr/14/2020 4:30 PM Apr/14/2020 7:30 PM Confirmed         Nursing Comments     ----------------------------------------------------------------------------------------------------------------------------  [facility] allows you to manage your health, view your test results, and retrieve your discharge documents from your hospital stay securely and conveniently from your computer.  To begin the enrollment process, visit [email or web address] Click on ???Sign up now??? under [facility].

## 2018-06-01 NOTE — Oncology Nurse Navigation (Signed)
 Oncology Patient Summary                     [facility]  [address]  [place], [place] [address]  [phone]        Person Information:  Name: SABREENA, VOGAN  Birthdate: 1975-06-16  Home Address: [address] [place] [address]      Any confirmed future appointments are below:                 Type Location Start [facility]   INF Oncology - Special Accommodation Sch SF Inf Ther Apr/21/2020 4:30 PM Apr/21/2020 7:30 PM Confirmed         Nursing Comments     ----------------------------------------------------------------------------------------------------------------------------  [facility] allows you to manage your health, view your test results, and retrieve your discharge documents from your hospital stay securely and conveniently from your computer.  To begin the enrollment process, visit [email or web address] Click on "Sign up now" under [facility].

## 2018-06-01 NOTE — Care Plan (Signed)
Ambulatory Patient Education     Patient Education Materials Follows:

## 2018-11-03 NOTE — Care Plan (Signed)
Ambulatory Patient Education     Patient Education Materials Follows:

## 2018-11-03 NOTE — Oncology Nurse Navigation (Signed)
Oncology Patient Summary                     [facility]  [address]  [place], [place] [address]  [phone]        Person Information:  Name: Jennifer Dickerson, Jennifer Dickerson  Birthdate: 06/24/1975  Home Address: [address] [place] [address]      Any confirmed future appointments are below:                 Type Location Start [facility]   INF Oncology - Special Accommodation Sch SF Inf Ther Sep/23/2020 3:00 PM Sep/23/2020 4:00 PM Confirmed   INF Oncology - Special Accommodation Sch SF Inf Ther Sep/30/2020 3:00 PM Sep/30/2020 4:00 PM Confirmed         Nursing Comments     ----------------------------------------------------------------------------------------------------------------------------  [facility] allows you to manage your health, view your test results, and retrieve your discharge documents from your hospital stay securely and conveniently from your computer.  To begin the enrollment process, visit [email or web address] Click on "Sign up now" under [facility].

## 2018-11-10 NOTE — Care Plan (Signed)
Ambulatory Patient Education     Patient Education Materials Follows:

## 2018-11-10 NOTE — Oncology Nurse Navigation (Signed)
Oncology Patient Summary                     [facility]  [address]  [place], [place] [address]  [phone]        Person Information:  Name: PINKIE, MANGER  Birthdate: 1975/08/02  Home Address: [address] [place] [address]      Any confirmed future appointments are below:                 Type Location Start Finish State   INF Oncology - Special Accommodation Sch SF Inf Ther Sep/30/2020 3:00 PM Sep/30/2020 4:00 PM Confirmed         Nursing Comments     ----------------------------------------------------------------------------------------------------------------------------  [facility] allows you to manage your health, view your test results, and retrieve your discharge documents from your hospital stay securely and conveniently from your computer.  To begin the enrollment process, visit [email or web address] Click on "Sign up now" under [facility].

## 2019-03-03 LAB — IRON AND TIBC
Iron Saturation: 12 % — ABNORMAL LOW (ref 20–40)
Iron: 44 ug/dL (ref 37–145)
TIBC: 379 ug/dL (ref 250–450)
UIBC: 335 ug/dL (ref 112.0–347.0)

## 2019-03-03 LAB — SOLUBLE TRANSFERRIN RECEPTOR: Soluble Transferrin Recept: 20.1 nmol/L (ref 12.2–27.3)

## 2019-03-03 LAB — FERRITIN: Ferritin: 28.4 ng/mL (ref 13.0–150.0)

## 2019-03-31 NOTE — Care Plan (Signed)
Ambulatory Patient Education     Patient Education Materials Follows:

## 2019-03-31 NOTE — Oncology Nurse Navigation (Signed)
 Oncology Patient Summary                     [facility]  [address]  [place], [place] [address]  [phone]        Person Information:  Name: Jennifer Dickerson, Jennifer Dickerson  Birthdate: [birth year]  Home Address: [address] [place] [address]      Any confirmed future appointments are below:                 Type Location Start [facility]   INF IV Infusion 1 Hr SF Inf Ther Feb/18/2021 9:30 AM Feb/18/2021 10:30 AM Confirmed   INF IV Infusion 1 Hr SF Inf Ther Feb/25/2021 9:30 AM Feb/25/2021 10:30 AM Confirmed         Nursing Comments     ----------------------------------------------------------------------------------------------------------------------------  [facility] allows you to manage your health, view your test results, and retrieve your discharge documents from your hospital stay securely and conveniently from your computer.  To begin the enrollment process, visit [email or web address] Click on "Sign up now" under [facility].

## 2019-04-07 NOTE — Care Plan (Signed)
Ambulatory Patient Education     Patient Education Materials Follows:

## 2019-04-07 NOTE — Care Plan (Signed)
Ambulatory Patient Education     Patient Education Materials Follows:

## 2019-04-07 NOTE — Oncology Nurse Navigation (Signed)
 Oncology Patient Summary                     [facility]  [address]  [place], [place] [address]  [phone]        Person Information:  Name: Jennifer Dickerson, Jennifer Dickerson  Birthdate: [birth year]  Home Address: [address] [place] [address]      Any confirmed future appointments are below:                 Type Location Start Finish State   INF IV Infusion 1 Hr SF Inf Ther Feb/25/2021 9:30 AM Feb/25/2021 10:30 AM Confirmed         Nursing Comments     ----------------------------------------------------------------------------------------------------------------------------  [facility] allows you to manage your health, view your test results, and retrieve your discharge documents from your hospital stay securely and conveniently from your computer.  To begin the enrollment process, visit [email or web address] Click on "Sign up now" under [facility].

## 2019-06-02 LAB — IRON AND TIBC
Iron Saturation: 19 % — ABNORMAL LOW (ref 20–40)
Iron: 55 ug/dL (ref 37–145)
TIBC: 296 ug/dL (ref 250–450)
UIBC: 241 ug/dL (ref 112.0–347.0)

## 2019-06-02 LAB — RETICULOCYTES
RBC: 3.91 x10e6/mcL (ref 3.60–5.20)
Retic Ct Abs: 0.1173 /mcL — ABNORMAL HIGH (ref 0.0210–0.1080)
Retic Ct Pct: 3 % — ABNORMAL HIGH (ref 0.5–2.0)

## 2019-06-02 LAB — FERRITIN: Ferritin: 141.8 ng/mL (ref 13.0–150.0)

## 2019-06-02 LAB — VITAMIN B12: Vitamin B-12: 846 pg/mL (ref 232–1245)

## 2019-06-02 LAB — FOLATE: Folate: 14.28 ng/mL (ref 4.80–24.20)

## 2019-06-03 LAB — SOLUBLE TRANSFERRIN RECEPTOR: Soluble Transferrin Recept: 19.6 nmol/L (ref 12.2–27.3)

## 2019-09-21 LAB — IRON AND TIBC
Iron Saturation: 5 % — ABNORMAL LOW (ref 20–40)
Iron: 20 ug/dL — ABNORMAL LOW (ref 37–145)
TIBC: 410 ug/dL (ref 250–450)
UIBC: 390 ug/dL — ABNORMAL HIGH (ref 112.0–347.0)

## 2019-09-21 LAB — FERRITIN: Ferritin: 6.9 ng/mL — ABNORMAL LOW (ref 13.0–150.0)

## 2019-09-22 LAB — SOLUBLE TRANSFERRIN RECEPTOR: Soluble Transferrin Recept: 26 nmol/L (ref 12.2–27.3)

## 2019-09-28 NOTE — Oncology Nurse Navigation (Signed)
Oncology Patient Summary                     [facility]  [address]  [place]. [address] [place], [address]  [phone]        Person Information:  Name: DESARIE, FEILD  Birthdate: Nov 15, 1975  Home Address: [address] [place] [address]      Any confirmed future appointments are below:                 Type Location Start Finish State   INF IV Infusion 1 Hr SF Inf Ther Aug/18/2021 12:00 PM Aug/18/2021 1:00 PM Confirmed         Nursing Comments     ----------------------------------------------------------------------------------------------------------------------------  [facility] allows you to manage your health, view your test results, and retrieve your discharge documents from your hospital stay securely and conveniently from your computer.  To begin the enrollment process, visit [email or web address] Click on "Sign up now" under [facility].

## 2019-09-28 NOTE — Care Plan (Signed)
Ambulatory Patient Education     Patient Education Materials Follows:

## 2019-10-05 NOTE — Oncology Nurse Navigation (Signed)
Oncology Patient Summary                     [facility]  [address]  [place]. [address], [place], [address]  [phone]        Person Information:  Name: Jennifer Dickerson, Jennifer Dickerson  Birthdate: [birth year]  Home Address: [address] [place] [address]      Any confirmed future appointments are below:                 Type Location Start Finish State   INF IV Infusion 1 Hr SF Inf Ther Aug/25/2021 4:00 PM Aug/25/2021 5:00 PM Confirmed         Nursing Comments     ----------------------------------------------------------------------------------------------------------------------------  [facility] allows you to manage your health, view your test results, and retrieve your discharge documents from your hospital stay securely and conveniently from your computer.  To begin the enrollment process, visit [email or web address] Click on "Sign up now" under [facility].

## 2019-10-05 NOTE — Care Plan (Signed)
Ambulatory Patient Education     Patient Education Materials Follows:

## 2019-12-28 LAB — IRON AND TIBC
Iron Saturation: 16 % — ABNORMAL LOW (ref 20–40)
Iron: 54 ug/dL (ref 37–145)
TIBC: 340 ug/dL (ref 250–450)
UIBC: 286 ug/dL (ref 112.0–347.0)

## 2019-12-28 LAB — FERRITIN: Ferritin: 44.1 ng/mL (ref 13.0–150.0)

## 2019-12-29 LAB — SOLUBLE TRANSFERRIN RECEPTOR: Soluble Transferrin Recept: 17.2 nmol/L (ref 12.2–27.3)

## 2020-04-05 LAB — IRON AND TIBC
Iron Saturation: 5 % — ABNORMAL LOW (ref 20–40)
Iron: 20 ug/dL — ABNORMAL LOW (ref 37–145)
TIBC: 427 ug/dL (ref 250–450)
UIBC: 407 ug/dL — ABNORMAL HIGH (ref 112.0–347.0)

## 2020-04-05 LAB — SOLUBLE TRANSFERRIN RECEPTOR: Soluble Transferrin Recept: 31.1 nmol/L — ABNORMAL HIGH (ref 12.2–27.3)

## 2020-04-05 LAB — FERRITIN: Ferritin: 7.8 ng/mL — ABNORMAL LOW (ref 13.0–150.0)

## 2020-06-15 LAB — FERRITIN: Ferritin: 4.4 ng/mL — ABNORMAL LOW (ref 13.0–150.0)

## 2020-06-15 LAB — IRON AND TIBC
Iron Saturation: 3 % — ABNORMAL LOW (ref 20–40)
Iron: 15 ug/dL — ABNORMAL LOW (ref 37–145)
TIBC: 455 ug/dL — ABNORMAL HIGH (ref 250–450)
UIBC: 440 ug/dL — ABNORMAL HIGH (ref 112.0–347.0)

## 2020-06-17 LAB — SOLUBLE TRANSFERRIN RECEPTOR: Soluble Transferrin Recept: 47 nmol/L — ABNORMAL HIGH (ref 12.2–27.3)

## 2021-03-01 ENCOUNTER — Encounter

## 2021-04-03 ENCOUNTER — Encounter

## 2021-06-05 ENCOUNTER — Encounter: Attending: Internal Medicine

## 2021-06-05 ENCOUNTER — Encounter
